# Patient Record
Sex: Female | Born: 1955 | Race: Black or African American | Hispanic: No | Marital: Married | Smoking: Never smoker
Health system: Southern US, Community
[De-identification: ages and names within clinical notes are randomized; demographics above are authoritative.]

## PROBLEM LIST (undated history)

## (undated) DIAGNOSIS — K589 Irritable bowel syndrome without diarrhea: Secondary | ICD-10-CM

## (undated) DIAGNOSIS — N200 Calculus of kidney: Secondary | ICD-10-CM

## (undated) DIAGNOSIS — A048 Other specified bacterial intestinal infections: Secondary | ICD-10-CM

## (undated) DIAGNOSIS — D219 Benign neoplasm of connective and other soft tissue, unspecified: Secondary | ICD-10-CM

## (undated) DIAGNOSIS — D573 Sickle-cell trait: Secondary | ICD-10-CM

## (undated) DIAGNOSIS — U071 COVID-19: Secondary | ICD-10-CM

## (undated) DIAGNOSIS — K219 Gastro-esophageal reflux disease without esophagitis: Secondary | ICD-10-CM

## (undated) DIAGNOSIS — D509 Iron deficiency anemia, unspecified: Secondary | ICD-10-CM

## (undated) DIAGNOSIS — Z87442 Personal history of urinary calculi: Secondary | ICD-10-CM

## (undated) DIAGNOSIS — D72819 Decreased white blood cell count, unspecified: Secondary | ICD-10-CM

## (undated) DIAGNOSIS — Z862 Personal history of diseases of the blood and blood-forming organs and certain disorders involving the immune mechanism: Secondary | ICD-10-CM

## (undated) DIAGNOSIS — M1990 Unspecified osteoarthritis, unspecified site: Secondary | ICD-10-CM

## (undated) DIAGNOSIS — R011 Cardiac murmur, unspecified: Secondary | ICD-10-CM

## (undated) DIAGNOSIS — T7840XA Allergy, unspecified, initial encounter: Secondary | ICD-10-CM

## (undated) HISTORY — DX: Gastro-esophageal reflux disease without esophagitis: K21.9

## (undated) HISTORY — DX: Calculus of kidney: N20.0

## (undated) HISTORY — DX: Cardiac murmur, unspecified: R01.1

## (undated) HISTORY — DX: Sickle-cell trait: D57.3

## (undated) HISTORY — PX: BREAST CYST EXCISION: SHX579

## (undated) HISTORY — DX: Irritable bowel syndrome, unspecified: K58.9

## (undated) HISTORY — DX: COVID-19: U07.1

## (undated) HISTORY — DX: Benign neoplasm of connective and other soft tissue, unspecified: D21.9

## (undated) HISTORY — PX: TUBAL LIGATION: SHX77

## (undated) HISTORY — DX: Other specified bacterial intestinal infections: A04.8

## (undated) HISTORY — PX: LITHOTRIPSY: SUR834

## (undated) HISTORY — DX: Decreased white blood cell count, unspecified: D72.819

## (undated) HISTORY — DX: Allergy, unspecified, initial encounter: T78.40XA

## (undated) HISTORY — PX: BREAST SURGERY: SHX581

---
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
## 2009-11-18 HISTORY — PX: LAPAROSCOPIC ASSISTED VAGINAL HYSTERECTOMY: SHX5398

## 2014-07-30 LAB — HM PAP SMEAR: HM Pap smear: NEGATIVE

## 2018-10-18 LAB — HM COLONOSCOPY

## 2019-12-16 ENCOUNTER — Encounter: Payer: Self-pay | Admitting: Adult Health

## 2019-12-16 ENCOUNTER — Ambulatory Visit (INDEPENDENT_AMBULATORY_CARE_PROVIDER_SITE_OTHER): Payer: Federal, State, Local not specified - PPO | Admitting: Adult Health

## 2019-12-16 ENCOUNTER — Other Ambulatory Visit: Payer: Self-pay

## 2019-12-16 VITALS — BP 122/82 | HR 69 | Temp 97.9°F | Resp 16 | Ht 66.0 in | Wt 146.2 lb

## 2019-12-16 DIAGNOSIS — Z1329 Encounter for screening for other suspected endocrine disorder: Secondary | ICD-10-CM

## 2019-12-16 DIAGNOSIS — R82998 Other abnormal findings in urine: Secondary | ICD-10-CM | POA: Insufficient documentation

## 2019-12-16 DIAGNOSIS — E559 Vitamin D deficiency, unspecified: Secondary | ICD-10-CM | POA: Insufficient documentation

## 2019-12-16 DIAGNOSIS — Z1389 Encounter for screening for other disorder: Secondary | ICD-10-CM

## 2019-12-16 DIAGNOSIS — N951 Menopausal and female climacteric states: Secondary | ICD-10-CM | POA: Insufficient documentation

## 2019-12-16 DIAGNOSIS — Z1231 Encounter for screening mammogram for malignant neoplasm of breast: Secondary | ICD-10-CM | POA: Insufficient documentation

## 2019-12-16 DIAGNOSIS — Z113 Encounter for screening for infections with a predominantly sexual mode of transmission: Secondary | ICD-10-CM

## 2019-12-16 DIAGNOSIS — Z0000 Encounter for general adult medical examination without abnormal findings: Secondary | ICD-10-CM

## 2019-12-16 DIAGNOSIS — Z13220 Encounter for screening for lipoid disorders: Secondary | ICD-10-CM

## 2019-12-16 DIAGNOSIS — Z90711 Acquired absence of uterus with remaining cervical stump: Secondary | ICD-10-CM | POA: Insufficient documentation

## 2019-12-16 DIAGNOSIS — M25449 Effusion, unspecified hand: Secondary | ICD-10-CM | POA: Insufficient documentation

## 2019-12-16 DIAGNOSIS — Z114 Encounter for screening for human immunodeficiency virus [HIV]: Secondary | ICD-10-CM

## 2019-12-16 DIAGNOSIS — Z90710 Acquired absence of both cervix and uterus: Secondary | ICD-10-CM

## 2019-12-16 HISTORY — DX: Encounter for screening for other disorder: Z13.89

## 2019-12-16 HISTORY — DX: Effusion, unspecified hand: M25.449

## 2019-12-16 HISTORY — DX: Menopausal and female climacteric states: N95.1

## 2019-12-16 HISTORY — DX: Other abnormal findings in urine: R82.998

## 2019-12-16 LAB — POCT URINALYSIS DIPSTICK
Bilirubin, UA: NEGATIVE
Blood, UA: NEGATIVE
Glucose, UA: NEGATIVE
Ketones, UA: NEGATIVE
Nitrite, UA: NEGATIVE
Protein, UA: POSITIVE — AB
Spec Grav, UA: 1.02 (ref 1.010–1.025)
Urobilinogen, UA: 0.2 E.U./dL
pH, UA: 5 (ref 5.0–8.0)

## 2019-12-16 NOTE — Patient Instructions (Addendum)
Atrophic Vaginitis Atrophic vaginitis is a condition in which the tissues that line the vagina become dry and thin. This condition occurs in women who have stopped having their period. It is caused by a drop in a female hormone (estrogen). This hormone helps:  To keep the vagina moist.  To make a clear fluid. This clear fluid helps: ? To make the vagina ready for sex. ? To protect the vagina from infection. If the lining of the vagina is dry and thin, it may cause irritation, burning, or itchiness. It may also:  Make sex painful.  Make an exam of your vagina painful.  Cause bleeding.  Make you lose interest in sex.  Cause a burning feeling when you pee (urinate).  Cause a brown or yellow fluid to come from your vagina. Some women do not have symptoms. Follow these instructions at home: Medicines  Take over-the-counter and prescription medicines only as told by your doctor.  Do not use herbs or other medicines unless your doctor says it is okay.  Use medicines for for dryness. These include: ? Oils to make the vagina soft. ? Creams. ? Moisturizers. General instructions  Do not douche.  Do not use products that can make your vagina dry. These include: ? Scented sprays. ? Scented tampons. ? Scented soaps.  Sex can help increase blood flow and soften the tissue in the vagina. If it hurts to have sex: ? Tell your partner. ? Use products to make sex more comfortable. Use these only as told by your doctor. Contact a doctor if you:  Have discharge from the vagina that is different than usual.  Have a bad smell coming from your vagina.  Have new symptoms.  Do not get better.  Get worse. Summary  Atrophic vaginitis is a condition in which the lining of the vagina becomes dry and thin.  This condition affects women who have stopped having their periods.  Treatment may include using products that help make the vagina soft.  Call a doctor if do not get better with  treatment. This information is not intended to replace advice given to you by your health care provider. Make sure you discuss any questions you have with your health care provider. Document Revised: 10/09/2017 Document Reviewed: 10/09/2017 Elsevier Patient Education  2020 [place] Maintenance, Female Adopting a healthy lifestyle and getting preventive care are important in promoting health and wellness. Ask your health care provider about:  The right schedule for you to have regular tests and exams.  Things you can do on your own to prevent diseases and keep yourself healthy. What should I know about diet, weight, and exercise? Eat a healthy diet   Eat a diet that includes plenty of vegetables, fruits, low-fat dairy products, and lean protein.  Do not eat a lot of foods that are high in solid fats, added sugars, or sodium. Maintain a healthy weight Body mass index (BMI) is used to identify weight problems. It estimates body fat based on height and weight. Your health care provider can help determine your BMI and help you achieve or maintain a healthy weight. Get regular exercise Get regular exercise. This is one of the most important things you can do for your health. Most adults should:  Exercise for at least 150 minutes each week. The exercise should increase your heart rate and make you sweat (moderate-intensity exercise).  Do strengthening exercises at least twice a week. This is in addition to the moderate-intensity exercise.  Spend less time sitting. Even light physical activity can be beneficial. Watch cholesterol and blood lipids Have your blood tested for lipids and cholesterol at 64 years of age, then have this test every 5 years. Have your cholesterol levels checked more often if:  Your lipid or cholesterol levels are high.  You are older than 64 years of age.  You are at high risk for heart disease. What should I know about cancer screening? Depending  on your health history and family history, you may need to have cancer screening at various ages. This may include screening for:  Breast cancer.  Cervical cancer.  Colorectal cancer.  Skin cancer.  Lung cancer. What should I know about heart disease, diabetes, and high blood pressure? Blood pressure and heart disease  High blood pressure causes heart disease and increases the risk of stroke. This is more likely to develop in people who have high blood pressure readings, are of African descent, or are overweight.  Have your blood pressure checked: ? Every 3-5 years if you are 77-29 years of age. ? Every year if you are 60 years old or older. Diabetes Have regular diabetes screenings. This checks your fasting blood sugar level. Have the screening done:  Once every three years after age 82 if you are at a normal weight and have a low risk for diabetes.  More often and at a younger age if you are overweight or have a high risk for diabetes. What should I know about preventing infection? Hepatitis B If you have a higher risk for hepatitis B, you should be screened for this virus. Talk with your health care provider to find out if you are at risk for hepatitis B infection. Hepatitis C Testing is recommended for:  Everyone born from [birth year] through 1965.  Anyone with known risk factors for hepatitis C. Sexually transmitted infections (STIs)  Get screened for STIs, including gonorrhea and chlamydia, if: ? You are sexually active and are younger than 64 years of age. ? You are older than 64 years of age and your health care provider tells you that you are at risk for this type of infection. ? Your sexual activity has changed since you were last screened, and you are at increased risk for chlamydia or gonorrhea. Ask your health care provider if you are at risk.  Ask your health care provider about whether you are at high risk for HIV. Your health care provider may recommend a  prescription medicine to help prevent HIV infection. If you choose to take medicine to prevent HIV, you should first get tested for HIV. You should then be tested every 3 months for as long as you are taking the medicine. Pregnancy  If you are about to stop having your period (premenopausal) and you may become pregnant, seek counseling before you get pregnant.  Take 400 to 800 micrograms (mcg) of folic acid every day if you become pregnant.  Ask for birth control (contraception) if you want to prevent pregnancy. Osteoporosis and menopause Osteoporosis is a disease in which the bones lose minerals and strength with aging. This can result in bone fractures. If you are 31 years old or older, or if you are at risk for osteoporosis and fractures, ask your health care provider if you should:  Be screened for bone loss.  Take a calcium or vitamin D supplement to lower your risk of fractures.  Be given hormone replacement therapy (HRT) to treat symptoms of menopause. Follow these instructions at  home: Lifestyle  Do not use any products that contain nicotine or tobacco, such as cigarettes, e-cigarettes, and chewing tobacco. If you need help quitting, ask your health care provider.  Do not use street drugs.  Do not share needles.  Ask your health care provider for help if you need support or information about quitting drugs. Alcohol use  Do not drink alcohol if: ? Your health care provider tells you not to drink. ? You are pregnant, may be pregnant, or are planning to become pregnant.  If you drink alcohol: ? Limit how much you use to 0-1 drink a day. ? Limit intake if you are breastfeeding.  Be aware of how much alcohol is in your drink. In the [place] one drink equals one 12 oz bottle of beer (355 mL), one 5 oz glass of wine (148 mL), or one 1 oz glass of hard liquor (44 mL). General instructions  Schedule regular health, dental, and eye exams.  Stay current with your  vaccines.  Tell your health care provider if: ? You often feel depressed. ? You have ever been abused or do not feel safe at home. Summary  Adopting a healthy lifestyle and getting preventive care are important in promoting health and wellness.  Follow your health care provider's instructions about healthy diet, exercising, and getting tested or screened for diseases.  Follow your health care provider's instructions on monitoring your cholesterol and blood pressure. This information is not intended to replace advice given to you by your health care provider. Make sure you discuss any questions you have with your health care provider. Document Revised: 09/19/2018 Document Reviewed: 09/19/2018 Elsevier Patient Education  2020 [employer].

## 2019-12-16 NOTE — Progress Notes (Addendum)
Patient: Kim Mitchell, Female    DOB: 05/22/1956, 64 y.o.   MRN: [id number] Visit Date: 12/16/2019  Today's Provider: Marcille Buffy, FNP   Chief Complaint  Patient presents with  . New Patient (Initial Visit)   Subjective:    New Patient Kim Mitchell is a 64 y.o. female who presents today for health maintenance and establish care as a new patient. Patient reports that she moved from [place] 9 months ago, her previous primary doctor was Dr. Clearnce Sorrel. Patient reports that last pap exam and colonoscopy screening was in 2019 and was normal, she states that her mammogram was last done in spring of 2020 and will be due.  She feels well. She reports she is not exercising, but reports that she is active taking care of her elderly family member - her great aunt is 36.    She reports she is sleeping fairly well, patient reports that she is restless at night.  She is feeling well.   Patient  denies any fever, body aches,chills, rash, chest pain, shortness of breath, nausea, vomiting, or diarrhea.   History of hysterectomy- laparoscopic assisted vaginal hysterectomy 11/18/2009 - she denies any malignancy.  She had estrogen ring and it was discontinued due to cost. Intercourse is painful due to dryness. Denies any lesions or sores. She has tried some lubricants and she reports some improvement.    She had colonoscopy and endoscopy and was given Liness. She reports all symptoms have improved.    Patient  denies any fever, body aches,chills, rash, chest pain, shortness of breath, nausea, vomiting, or diarrhea.    -----------------------------------------------------------------   Review of Systems  Constitutional: Negative for activity change, appetite change, chills, diaphoresis, fatigue, fever and unexpected weight change.  HENT: Positive for sinus pressure. Negative for congestion, dental problem, drooling, ear discharge, ear pain, facial swelling, hearing  loss, mouth sores, nosebleeds, postnasal drip, rhinorrhea, sinus pain, sneezing, sore throat, tinnitus, trouble swallowing and voice change.   Respiratory: Negative.   Cardiovascular: Negative.   Gastrointestinal: Positive for abdominal distention and constipation (occasioinally. Denies any rectal or oral bleeding. ). Negative for abdominal pain, anal bleeding, blood in stool, diarrhea, nausea, rectal pain and vomiting.  Genitourinary: Negative for decreased urine volume, difficulty urinating, dysuria, enuresis, flank pain, frequency, genital sores, hematuria, menstrual problem, pelvic pain, urgency, vaginal bleeding, vaginal discharge and vaginal pain. Dyspareunia: vaginal dryness since hysterectomy, denies any sores or lesions. lubricant helps. denies any abnormal vaginal discharge. She has no desire for hormones she reports.   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 ). Negative for arthralgias, back pain, gait problem, myalgias, neck pain and neck stiffness.  Skin: Negative.   Allergic/Immunologic: Positive for environmental allergies.  Neurological: Negative.   Psychiatric/Behavioral: Negative.   All other systems reviewed and are negative.   Social History She   Social History   Socioeconomic History  . Marital status: Married    Spouse name: Not on file  . Number of children: Not on file  . Years of education: Not on file  . Highest education level: Not on file  Occupational History  . Not on file  Tobacco Use  . Smoking status: Not on file  Substance and Sexual Activity  . Alcohol use: Not on file  . Drug use: Not on file  . Sexual activity: Not on file  Other Topics Concern  . Not on file  Social History Narrative  . Not on file   Social Determinants of Health   Financial Resource Strain:   . Difficulty of Paying Living Expenses: Not on file  Food Insecurity:   . Worried About Charity fundraiser in the  Last Year: Not on file  . Ran Out of Food in the Last Year: Not on file  Transportation Needs:   . Lack of Transportation (Medical): Not on file  . Lack of Transportation (Non-Medical): Not on file  Physical Activity:   . Days of Exercise per Week: Not on file  . Minutes of Exercise per Session: Not on file  Stress:   . Feeling of Stress : Not on file  Social Connections:   . Frequency of Communication with Friends and Family: Not on file  . Frequency of Social Gatherings with Friends and Family: Not on file  . Attends Religious Services: Not on file  . Active Member of Clubs or Organizations: Not on file  . Attends Archivist Meetings: Not on file  . Marital Status: Not on file    There are no problems to display for this patient.   Past Surgical History:  Procedure Laterality Date  . ABDOMINAL HYSTERECTOMY    . BREAST SURGERY    . TUBAL LIGATION       Family History  Family Status  Relation Name Status  . Mat Aunt  (Not Specified)  . MGM  (Not Specified)  . MGF  (Not Specified)   Her family history includes Cancer in her maternal aunt and maternal grandfather; Hypertension in her maternal grandmother.     Allergies  Allergen Reactions  . Sulfa Antibiotics Swelling    Facial Swelling  . Demerol [Meperidine Hcl] Rash    Previous Medications   CALCIUM CARBONATE-VITAMIN D (CALCIUM 600+D HIGH POTENCY) 600-400 MG-UNIT TABLET    Take 1 tablet by mouth daily.   CHOLECALCIFEROL (VITAMIN D3) 50 MCG (2000 UT) TABS    Take by mouth.   LINACLOTIDE (LINZESS) 145 MCG CAPS CAPSULE    Take 145 mcg by mouth daily before breakfast.   MULTIPLE VITAMINS-MINERALS (CENTRUM SILVER PO)    Take by mouth.    Patient Care Team: Doreen Beam, FNP as PCP - General (Family Medicine)      Objective:  Blood pressure 122/82, pulse 69, temperature 97.9 F (36.6 C), temperature source Temporal, resp. rate 16, height 5\' 6"  (1.676 m), weight 146 lb 3.2 oz (66.3 kg), SpO2 99  %.   Physical Exam Vitals reviewed.  Constitutional:      General: She is not in acute distress.    Appearance: She is well-developed. She is not diaphoretic.     Interventions: She is not intubated. HENT:     Head: Normocephalic and atraumatic.     Right Ear: External ear normal.     Left Ear: External ear normal.     Nose: Nose normal.     Mouth/Throat:     Pharynx: No oropharyngeal exudate.  Eyes:     General: Lids are normal. No scleral icterus.       Right eye: No discharge.        Left eye: No discharge.     Conjunctiva/sclera: Conjunctivae normal.     Right eye: Right conjunctiva is not injected. No exudate or hemorrhage.    Left eye: Left conjunctiva is not injected. No exudate or hemorrhage.    Pupils: Pupils are equal, round, and reactive to light.  Neck:  Thyroid: No thyroid mass or thyromegaly.     Vascular: Normal carotid pulses. No carotid bruit, hepatojugular reflux or JVD.     Trachea: Trachea and phonation normal. No tracheal tenderness or tracheal deviation.     Meningeal: Brudzinski's sign and Kernig's sign absent.  Cardiovascular:     Rate and Rhythm: Normal rate and regular rhythm.     Pulses: Normal pulses.          Radial pulses are 2+ on the right side and 2+ on the left side.       Dorsalis pedis pulses are 2+ on the right side and 2+ on the left side.       Posterior tibial pulses are 2+ on the right side and 2+ on the left side.     Heart sounds: Normal heart sounds, S1 normal and S2 normal. Heart sounds not distant. No murmur. No friction rub. No gallop.   Pulmonary:     Effort: Pulmonary effort is normal. No tachypnea, bradypnea, accessory muscle usage or respiratory distress. She is not intubated.     Breath sounds: Normal breath sounds. No stridor. No wheezing or rales.  Chest:     Chest wall: No tenderness.  Abdominal:     General: Bowel sounds are normal. There is no distension or abdominal bruit.     Palpations: Abdomen is soft. There is  no shifting dullness, fluid wave, hepatomegaly, splenomegaly, mass or pulsatile mass.     Tenderness: There is no abdominal tenderness. There is no guarding or rebound.     Hernia: No hernia is present.  Genitourinary:    Comments: Deferred, patient will return for vaginal exam as needed.   No LMP recorded. Patient has had a hysterectomy.  Musculoskeletal:        General: No tenderness or deformity. Normal range of motion.     Cervical back: Full passive range of motion without pain, normal range of motion and neck supple. No edema, erythema or rigidity. No spinous process tenderness or muscular tenderness. Normal range of motion.  Lymphadenopathy:     Head:     Right side of head: No submental, submandibular, tonsillar, preauricular, posterior auricular or occipital adenopathy.     Left side of head: No submental, submandibular, tonsillar, preauricular, posterior auricular or occipital adenopathy.     Cervical: No cervical adenopathy.     Right cervical: No superficial, deep or posterior cervical adenopathy.    Left cervical: No superficial, deep or posterior cervical adenopathy.     Upper Body:     Right upper body: No supraclavicular or pectoral adenopathy.     Left upper body: No supraclavicular or pectoral adenopathy.  Skin:    General: Skin is warm and dry.     Coloration: Skin is not pale.     Findings: No abrasion, bruising, burn, ecchymosis, erythema, lesion, petechiae or rash.     Nails: There is no clubbing.  Neurological:     Mental Status: She is alert and oriented to person, place, and time.     GCS: GCS eye subscore is 4. GCS verbal subscore is 5. GCS motor subscore is 6.     Cranial Nerves: Cranial nerves are intact. No cranial nerve deficit.     Sensory: Sensation is intact. No sensory deficit.     Motor: Motor function is intact. No tremor, atrophy, abnormal muscle tone or seizure activity.     Coordination: Coordination is intact. Coordination normal.     Gait:  Gait  normal.     Deep Tendon Reflexes: Reflexes are normal and symmetric. Reflexes normal. Babinski sign absent on the right side. Babinski sign absent on the left side.     Reflex Scores:      Tricep reflexes are 2+ on the right side and 2+ on the left side.      Bicep reflexes are 2+ on the right side and 2+ on the left side.      Brachioradialis reflexes are 2+ on the right side and 2+ on the left side.      Patellar reflexes are 2+ on the right side and 2+ on the left side.      Achilles reflexes are 2+ on the right side and 2+ on the left side.    Comments: No appreciable swelling in bilateral hand/ finger joints.  She has this intermittently reported.   Psychiatric:        Attention and Perception: Attention and perception normal.        Mood and Affect: Mood and affect normal.        Speech: Speech normal.        Behavior: Behavior normal. Behavior is cooperative.        Thought Content: Thought content normal.        Cognition and Memory: Cognition normal.        Judgment: Judgment normal.      Depression Screen PHQ 2/9 Scores 12/16/2019  PHQ - 2 Score 0  PHQ- 9 Score 2      Assessment & Plan:     Routine Health Maintenance and Physical Exam  Exercise Activities and Dietary recommendations Goals   None      There is no immunization history on file for this patient.  Health Maintenance  Topic Date Due  . Hepatitis C Screening  06-26-1956  . HIV Screening  09/21/1971  . TETANUS/TDAP  09/21/1975  . PAP SMEAR-Modifier  09/20/1977  . MAMMOGRAM  09/20/2006  . COLONOSCOPY  09/20/2006  . INFLUENZA VACCINE  05/11/2019    Encounter for routine adult health examination without abnormal findings - Plan: POCT urinalysis dipstick  Encounter for screening mammogram for malignant neoplasm of breast - Plan: MM Digital Screening  Leukocytes in urine - Plan: Urine Culture  Vitamin D deficiency - Plan: VITAMIN D 25 Hydroxy (Vit-D Deficiency, Fractures)  Swelling of  joint of hand, unspecified laterality - Plan: CBC with Differential/Platelet, Comprehensive Metabolic Panel (CMET), Rheumatoid factor  Screening for HIV (human immunodeficiency virus) - Plan: HIV Antibody (routine testing w rflx)  Screening for STD (sexually transmitted disease) - Plan: Hepatitis panel, acute, HIV Antibody (routine testing w rflx)  Screening cholesterol level - Plan: Lipid Panel w/o Chol/HDL Ratio  Screening for thyroid disorder - Plan: TSH  Vaginal dryness, menopausal  History of hysterectomy- full   Yearly eye exams and every 6 months dental screenings/ exams/ cleaning is recommended.  Will check rheumatoid level, she has had joint swelling in hands for many years, no change or worsening symptom.  She has been screened for hepatitis in past- desires screening again, her husband was treated for Hep C and is in remission after treatment. She has never tested positive. She would also like HIV testing and has no other STD testing concerns at today's visit.  Orders Placed This Encounter  Procedures  . Urine Culture  . MM Digital Screening    Standing Status:   Future    Standing Expiration Date:   02/14/2021    Order  Specific Question:   Reason for Exam (SYMPTOM  OR DIAGNOSIS REQUIRED)    Answer:   breast cancer screening    Order Specific Question:   Preferred imaging location?    Answer:   [facility]  . CBC with Differential/Platelet  . Comprehensive Metabolic Panel (CMET)  . Lipid Panel w/o Chol/HDL Ratio  . TSH  . VITAMIN D 25 Hydroxy (Vit-D Deficiency, Fractures)  . Rheumatoid factor  . Hepatitis panel, acute  . HIV Antibody (routine testing w rflx)  . POCT urinalysis dipstick   No vaginal exam performed she will return for vaginal exam as needed, she has had a full hysterectomy. No abnormal discharge. She does have vaginal dryness and pain with intercourse that is relieved some with lubricant.  Yearly physical with labs and return for vaginal exam as  needed and also PRN for any other concerns. She has no dysuria or other symptoms, does have leukocytes in urine on POCT today in office, will culture and call if any bacteria that needs treating. Patient to report any symptoms to clinic.   Return if symptoms worsen or fail to improve, for Go to Emergency room/ urgent care if worse.  Advised patient call the office or your primary care doctor for an appointment if no improvement within 72 hours or if any symptoms change or worsen at any time  Advised ER or urgent Care if after hours or on weekend. Call 911 for emergency symptoms at any time.Patinet verbalized understanding of all instructions given/reviewed and treatment plan and has no further questions or concerns at this time.     Discussed health benefits of physical activity, and encouraged her to engage in regular exercise appropriate for her age and condition.   The entirety of the information documented in the History of Present Illness, Review of Systems and Physical Exam were personally obtained by me. Portions of this information were initially documented by the  Certified Medical Assistant whose name is documented in [facility] and reviewed by me for thoroughness and accuracy.  I have personally performed the exam and reviewed the chart and it is accurate to the best of my knowledge.  Haematologist has been used and any errors in dictation or transcription are unintentional.  Kelby Aline. [facility]    --------------------------------------------------------------------

## 2019-12-16 NOTE — Progress Notes (Signed)
Sent for culture.

## 2019-12-17 LAB — COMPREHENSIVE METABOLIC PANEL
ALT: 15 IU/L (ref 0–32)
AST: 24 IU/L (ref 0–40)
Albumin/Globulin Ratio: 2 (ref 1.2–2.2)
Albumin: 4.6 g/dL (ref 3.8–4.8)
Alkaline Phosphatase: 69 IU/L (ref 39–117)
BUN/Creatinine Ratio: 16 (ref 12–28)
BUN: 14 mg/dL (ref 8–27)
Bilirubin Total: 0.2 mg/dL (ref 0.0–1.2)
CO2: 25 mmol/L (ref 20–29)
Calcium: 9.4 mg/dL (ref 8.7–10.3)
Chloride: 106 mmol/L (ref 96–106)
Creatinine, Ser: 0.87 mg/dL (ref 0.57–1.00)
GFR calc Af Amer: 82 mL/min/{1.73_m2} (ref >59)
GFR calc non Af Amer: 71 mL/min/{1.73_m2} (ref >59)
Globulin, Total: 2.3 g/dL (ref 1.5–4.5)
Glucose: 84 mg/dL (ref 65–99)
Potassium: 5 mmol/L (ref 3.5–5.2)
Sodium: 143 mmol/L (ref 134–144)
Total Protein: 6.9 g/dL (ref 6.0–8.5)

## 2019-12-17 LAB — CBC WITH DIFFERENTIAL/PLATELET
Basophils Absolute: 0 10*3/uL (ref 0.0–0.2)
Basos: 1 %
EOS (ABSOLUTE): 0.3 10*3/uL (ref 0.0–0.4)
Eos: 9 %
Hematocrit: 38.5 % (ref 34.0–46.6)
Hemoglobin: 12.1 g/dL (ref 11.1–15.9)
Immature Grans (Abs): 0 10*3/uL (ref 0.0–0.1)
Immature Granulocytes: 0 %
Lymphocytes Absolute: 1.6 10*3/uL (ref 0.7–3.1)
Lymphs: 47 %
MCH: 23.4 pg — ABNORMAL LOW (ref 26.6–33.0)
MCHC: 31.4 g/dL — ABNORMAL LOW (ref 31.5–35.7)
MCV: 75 fL — ABNORMAL LOW (ref 79–97)
Monocytes Absolute: 0.4 10*3/uL (ref 0.1–0.9)
Monocytes: 11 %
Neutrophils Absolute: 1.1 10*3/uL — ABNORMAL LOW (ref 1.4–7.0)
Neutrophils: 32 %
Platelets: 222 10*3/uL (ref 150–450)
RBC: 5.17 x10E6/uL (ref 3.77–5.28)
RDW: 14.7 % (ref 11.7–15.4)
WBC: 3.4 10*3/uL (ref 3.4–10.8)

## 2019-12-17 LAB — HEPATITIS PANEL, ACUTE
Hep A IgM: NEGATIVE
Hep B C IgM: NEGATIVE
Hep C Virus Ab: <0.1 s/co ratio (ref 0.0–0.9)
Hepatitis B Surface Ag: NEGATIVE

## 2019-12-17 LAB — VITAMIN D 25 HYDROXY (VIT D DEFICIENCY, FRACTURES): Vit D, 25-Hydroxy: 43 ng/mL (ref 30.0–100.0)

## 2019-12-17 LAB — LIPID PANEL W/O CHOL/HDL RATIO
Cholesterol, Total: 147 mg/dL (ref 100–199)
HDL: 60 mg/dL (ref >39)
LDL Chol Calc (NIH): 76 mg/dL (ref 0–99)
Triglycerides: 48 mg/dL (ref 0–149)
VLDL Cholesterol Cal: 11 mg/dL (ref 5–40)

## 2019-12-17 LAB — RHEUMATOID FACTOR: Rheumatoid fact SerPl-aCnc: <10 IU/mL (ref 0.0–13.9)

## 2019-12-17 LAB — TSH: TSH: 3.32 u[IU]/mL (ref 0.450–4.500)

## 2019-12-17 LAB — HIV ANTIBODY (ROUTINE TESTING W REFLEX): HIV Screen 4th Generation wRfx: NONREACTIVE

## 2019-12-19 ENCOUNTER — Ambulatory Visit
Admission: RE | Admit: 2019-12-19 | Discharge: 2019-12-19 | Disposition: A | Discharge status: Home / Self Care | Payer: Federal, State, Local not specified - PPO | Source: Ambulatory Visit | Attending: Adult Health | Admitting: Adult Health

## 2019-12-19 DIAGNOSIS — Z1231 Encounter for screening mammogram for malignant neoplasm of breast: Secondary | ICD-10-CM | POA: Insufficient documentation

## 2019-12-19 LAB — URINE CULTURE

## 2019-12-20 NOTE — Progress Notes (Signed)
CBC shows no signs of infection. Mild irregularities showing she may be becoming anemic. Verify no rectal or vaginal bleeding. Taking an iron supplement regularly ? We added on a iron panel to labs and it is still pending.  CMP is normal, electrolytes normal as well as kidney and liver function.  Cholesterol looks great within normal range.  Thyroid lab within normal range. RA factor is negative. Acute hepatitis A, B and C is negative. HIV negative.  Vitamin D level is normal - recommend Vitamin D 3 over the counter at 2,000 to 4,000 international units daily by mouth.  Recheck CBC in around 3 to 4 months we can do Vitamin d then as well.

## 2019-12-27 ENCOUNTER — Ambulatory Visit: Payer: Federal, State, Local not specified - PPO | Attending: Internal Medicine

## 2019-12-27 DIAGNOSIS — Z23 Encounter for immunization: Secondary | ICD-10-CM

## 2019-12-27 NOTE — Progress Notes (Signed)
Normal mammogram- repeat in one year per radiology.

## 2019-12-27 NOTE — Progress Notes (Signed)
   Covid-19 Vaccination Clinic  Name:  Kim Mitchell    MRN: [id number] DOB: May 14, 1956  12/27/2019  [name] was observed post Covid-19 immunization for 15 minutes without incident. She was provided with Vaccine Information Sheet and instruction to access the V-Safe system.   [name] was instructed to call 911 with any severe reactions post vaccine: Marland Kitchen Difficulty breathing  . Swelling of face and throat  . A fast heartbeat  . A bad rash all over body  . Dizziness and weakness   Immunizations Administered    Name Date Dose VIS Date Route   Pfizer COVID-19 Vaccine 12/27/2019  9:44 AM 0.3 mL 09/20/2019 Intramuscular   Manufacturer: [place]   Lot: [id number]   [place]: [id number]

## 2020-01-21 ENCOUNTER — Ambulatory Visit: Payer: Federal, State, Local not specified - PPO | Attending: Internal Medicine

## 2020-01-21 DIAGNOSIS — Z23 Encounter for immunization: Secondary | ICD-10-CM

## 2020-01-21 NOTE — Progress Notes (Signed)
   Covid-19 Vaccination Clinic  Name:  Kim Mitchell    MRN: [id number] DOB: [birth year]  01/21/2020  Kim Mitchell was observed post Covid-19 immunization for 15 minutes without incident. She was provided with Vaccine Information Sheet and instruction to access the V-Safe system.   Kim Mitchell was instructed to call 911 with any severe reactions post vaccine: Marland Kitchen Difficulty breathing  . Swelling of face and throat  . A fast heartbeat  . A bad rash all over body  . Dizziness and weakness   Immunizations Administered    Name Date Dose VIS Date Route   Pfizer COVID-19 Vaccine 01/21/2020 10:41 AM 0.3 mL 09/20/2019 Intramuscular   Manufacturer: [place]   Lot: [id number]   [place]: [id number]

## 2020-08-20 DIAGNOSIS — Z03818 Encounter for observation for suspected exposure to other biological agents ruled out: Secondary | ICD-10-CM | POA: Diagnosis not present

## 2020-08-20 DIAGNOSIS — Z20822 Contact with and (suspected) exposure to covid-19: Secondary | ICD-10-CM | POA: Diagnosis not present

## 2020-09-30 DIAGNOSIS — Z20822 Contact with and (suspected) exposure to covid-19: Secondary | ICD-10-CM | POA: Diagnosis not present

## 2020-12-17 ENCOUNTER — Encounter: Payer: Federal, State, Local not specified - PPO | Admitting: Adult Health

## 2020-12-23 ENCOUNTER — Ambulatory Visit
Admission: RE | Admit: 2020-12-23 | Discharge: 2020-12-23 | Disposition: A | Discharge status: Home / Self Care | Payer: Federal, State, Local not specified - PPO | Attending: Adult Health | Admitting: Adult Health

## 2020-12-23 ENCOUNTER — Encounter: Payer: Self-pay | Admitting: Adult Health

## 2020-12-23 ENCOUNTER — Ambulatory Visit (INDEPENDENT_AMBULATORY_CARE_PROVIDER_SITE_OTHER): Payer: Federal, State, Local not specified - PPO | Admitting: Adult Health

## 2020-12-23 ENCOUNTER — Telehealth: Payer: Self-pay

## 2020-12-23 ENCOUNTER — Other Ambulatory Visit: Payer: Self-pay

## 2020-12-23 ENCOUNTER — Ambulatory Visit
Admission: RE | Admit: 2020-12-23 | Discharge: 2020-12-23 | Disposition: A | Discharge status: Home / Self Care | Payer: Federal, State, Local not specified - PPO | Source: Ambulatory Visit | Attending: Adult Health | Admitting: Adult Health

## 2020-12-23 VITALS — BP 127/81 | HR 87 | Temp 98.0°F | Resp 15 | Wt 149.6 lb

## 2020-12-23 DIAGNOSIS — M25541 Pain in joints of right hand: Secondary | ICD-10-CM

## 2020-12-23 DIAGNOSIS — J302 Other seasonal allergic rhinitis: Secondary | ICD-10-CM

## 2020-12-23 DIAGNOSIS — M25572 Pain in left ankle and joints of left foot: Secondary | ICD-10-CM | POA: Diagnosis not present

## 2020-12-23 DIAGNOSIS — E559 Vitamin D deficiency, unspecified: Secondary | ICD-10-CM | POA: Diagnosis not present

## 2020-12-23 DIAGNOSIS — R718 Other abnormality of red blood cells: Secondary | ICD-10-CM | POA: Diagnosis not present

## 2020-12-23 DIAGNOSIS — Z1389 Encounter for screening for other disorder: Secondary | ICD-10-CM

## 2020-12-23 DIAGNOSIS — Z1231 Encounter for screening mammogram for malignant neoplasm of breast: Secondary | ICD-10-CM

## 2020-12-23 DIAGNOSIS — Z90711 Acquired absence of uterus with remaining cervical stump: Secondary | ICD-10-CM | POA: Diagnosis not present

## 2020-12-23 DIAGNOSIS — M7989 Other specified soft tissue disorders: Secondary | ICD-10-CM | POA: Diagnosis not present

## 2020-12-23 LAB — POCT URINALYSIS DIPSTICK (MANUAL)
Leukocytes, UA: NEGATIVE
Nitrite, UA: NEGATIVE
Poct Bilirubin: NEGATIVE
Poct Blood: NEGATIVE
Poct Glucose: NORMAL mg/dL
Poct Ketones: NEGATIVE
Poct Protein: NEGATIVE mg/dL
Poct Urobilinogen: NORMAL mg/dL
Spec Grav, UA: 1.01 (ref 1.010–1.025)
pH, UA: 5 (ref 5.0–8.0)

## 2020-12-23 MED ORDER — LEVOCETIRIZINE DIHYDROCHLORIDE 5 MG PO TABS: 5.0000 mg | ORAL_TABLET | Freq: Every evening | ORAL | 1 refills | Status: DC

## 2020-12-23 MED ORDER — FLUTICASONE PROPIONATE 50 MCG/ACT NA SUSP: 2.0000 | Freq: Every day | NASAL | 2 refills | Status: DC

## 2020-12-23 NOTE — Patient Instructions (Signed)
Call to schedule your screening mammogram. Your orders have been placed for your exam.  Let our office know if you have questions, concerns, or any difficulty scheduling.  If normal results then yearly screening mammograms are recommended unless you notice  Changes in your breast then you should schedule a follow up office visit. If abnormal results  Further imaging will be warranted and sooner follow up as determined by the radiologist at the [facility].   [facility] [address] [place], [place] [address]  Main: [phone]     Health Maintenance, Female Adopting a healthy lifestyle and getting preventive care are important in promoting health and wellness. Ask your health care provider about:  The right schedule for you to have regular tests and exams.  Things you can do on your own to prevent diseases and keep yourself healthy. What should I know about diet, weight, and exercise? Eat a healthy diet  Eat a diet that includes plenty of vegetables, fruits, low-fat dairy products, and lean protein.  Do not eat a lot of foods that are high in solid fats, added sugars, or sodium.   Maintain a healthy weight Body mass index (BMI) is used to identify weight problems. It estimates body fat based on height and weight. Your health care provider can help determine your BMI and help you achieve or maintain a healthy weight. Get regular exercise Get regular exercise. This is one of the most important things you can do for your health. Most adults should:  Exercise for at least 150 minutes each week. The exercise should increase your heart rate and make you sweat (moderate-intensity exercise).  Do strengthening exercises at least twice a week. This is in addition to the moderate-intensity exercise.  Spend less time sitting. Even light physical activity can be beneficial. Watch cholesterol and blood lipids Have your blood tested for lipids and  cholesterol at 65 years of age, then have this test every 5 years. Have your cholesterol levels checked more often if:  Your lipid or cholesterol levels are high.  You are older than 65 years of age.  You are at high risk for heart disease. What should I know about cancer screening? Depending on your health history and family history, you may need to have cancer screening at various ages. This may include screening for:  Breast cancer.  Cervical cancer.  Colorectal cancer.  Skin cancer.  Lung cancer. What should I know about heart disease, diabetes, and high blood pressure? Blood pressure and heart disease  High blood pressure causes heart disease and increases the risk of stroke. This is more likely to develop in people who have high blood pressure readings, are of African descent, or are overweight.  Have your blood pressure checked: ? Every 3-5 years if you are 18-39 years of age. ? Every year if you are 40 years old or older. Diabetes Have regular diabetes screenings. This checks your fasting blood sugar level. Have the screening done:  Once every three years after age 40 if you are at a normal weight and have a low risk for diabetes.  More often and at a younger age if you are overweight or have a high risk for diabetes. What should I know about preventing infection? Hepatitis B If you have a higher risk for hepatitis B, you should be screened for this virus. Talk with your health care provider to find out if you are at risk for hepatitis B infection. Hepatitis   C Testing is recommended for:  Everyone born from 1945 through 1965.  Anyone with known risk factors for hepatitis C. Sexually transmitted infections (STIs)  Get screened for STIs, including gonorrhea and chlamydia, if: ? You are sexually active and are younger than 65 years of age. ? You are older than 65 years of age and your health care provider tells you that you are at risk for this type of  infection. ? Your sexual activity has changed since you were last screened, and you are at increased risk for chlamydia or gonorrhea. Ask your health care provider if you are at risk.  Ask your health care provider about whether you are at high risk for HIV. Your health care provider may recommend a prescription medicine to help prevent HIV infection. If you choose to take medicine to prevent HIV, you should first get tested for HIV. You should then be tested every 3 months for as long as you are taking the medicine. Pregnancy  If you are about to stop having your period (premenopausal) and you may become pregnant, seek counseling before you get pregnant.  Take 400 to 800 micrograms (mcg) of folic acid every day if you become pregnant.  Ask for birth control (contraception) if you want to prevent pregnancy. Osteoporosis and menopause Osteoporosis is a disease in which the bones lose minerals and strength with aging. This can result in bone fractures. If you are 65 years old or older, or if you are at risk for osteoporosis and fractures, ask your health care provider if you should:  Be screened for bone loss.  Take a calcium or vitamin D supplement to lower your risk of fractures.  Be given hormone replacement therapy (HRT) to treat symptoms of menopause. Follow these instructions at home: Lifestyle  Do not use any products that contain nicotine or tobacco, such as cigarettes, e-cigarettes, and chewing tobacco. If you need help quitting, ask your health care provider.  Do not use street drugs.  Do not share needles.  Ask your health care provider for help if you need support or information about quitting drugs. Alcohol use  Do not drink alcohol if: ? Your health care provider tells you not to drink. ? You are pregnant, may be pregnant, or are planning to become pregnant.  If you drink alcohol: ? Limit how much you use to 0-1 drink a day. ? Limit intake if you are  breastfeeding.  Be aware of how much alcohol is in your drink. In the [place] one drink equals one 12 oz bottle of beer (355 mL), one 5 oz glass of wine (148 mL), or one 1 oz glass of hard liquor (44 mL). General instructions  Schedule regular health, dental, and eye exams.  Stay current with your vaccines.  Tell your health care provider if: ? You often feel depressed. ? You have ever been abused or do not feel safe at home. Summary  Adopting a healthy lifestyle and getting preventive care are important in promoting health and wellness.  Follow your health care provider's instructions about healthy diet, exercising, and getting tested or screened for diseases.  Follow your health care provider's instructions on monitoring your cholesterol and blood pressure. This information is not intended to replace advice given to you by your health care provider. Make sure you discuss any questions you have with your health care provider. Document Revised: 09/19/2018 Document Reviewed: 09/19/2018 Elsevier Patient Education  2021 [employer].  

## 2020-12-23 NOTE — Telephone Encounter (Signed)
Patient walked into office and I explained as below. KW

## 2020-12-23 NOTE — Telephone Encounter (Signed)
Copied from [place] [phone]. Topic: General - Other >> Dec 23, 2020 12:14 PM Keene Breath wrote: Reason for CRM: Patient called to ask some questions regarding her recent visit today.  Patient would like to ask about the x-ray and gyn recommendations.  She stated that she just has some questions.  CB# [phone]

## 2020-12-23 NOTE — Telephone Encounter (Signed)
Please call patient let her know x ray of left ankle is at out patient imaging walk in.  I reffered her to Fraser Din at [facility] and she should get a call within 2 weeks.  Let me know if further questions.

## 2020-12-23 NOTE — Progress Notes (Signed)
Complete physical exam   Patient: Kim Mitchell   DOB: Sep 22, 1956   65 y.o. Female  MRN: [id number] Visit Date: 12/23/2020  Today's healthcare provider: Marcille Buffy, FNP   Chief Complaint  Patient presents with  . Annual Exam   Subjective    Kim Mitchell is a 65 y.o. female who presents today for a complete physical exam.   HPI  She reports consuming a general diet. The patient does not participate in regular exercise at present.   She generally feels well. She reports sleeping fairly well. She does have additional problems to discuss today. Patient would like to address today pain in her joints and vaginal pain with intercourse.  She has had painful intercourse since hysterectomy in 2011  And has  Tried multiple trials and has not improved seeing Gynecology.   Patient reports concerns of possible sinus pain above left eyebrow area.    Colonoscopy and endoscopy 2019 she believes, she had it done in [place] and due for repeat in 5 years.   Patient  denies any fever, body aches,chills, rash, chest pain, shortness of breath, nausea, vomiting, or diarrhea.  Denies dizziness, lightheadedness, pre syncopal or syncopal episodes.  Still has cervix.  Last PAP up to date with gynecology.   Past Medical History:  Diagnosis Date  . Fibroids    confrimed from medical records  . GERD (gastroesophageal reflux disease)   . Heart murmur    Past Surgical History:  Procedure Laterality Date  . BREAST CYST EXCISION Right 50 years ago   neg  . BREAST SURGERY    . LAPAROSCOPIC ASSISTED VAGINAL HYSTERECTOMY  11/18/2009  . TUBAL LIGATION     Social History   Socioeconomic History  . Marital status: Married    Spouse name: Not on file  . Number of children: Not on file  . Years of education: Not on file  . Highest education level: Not on file  Occupational History  . Not on file  Tobacco Use  . Smoking status: Never Smoker  . Smokeless tobacco: Never Used   Substance and Sexual Activity  . Alcohol use: Not Currently  . Drug use: Never  . Sexual activity: Not Currently  Other Topics Concern  . Not on file  Social History Narrative  . Not on file   Social Determinants of Health   Financial Resource Strain: Not on file  Food Insecurity: Not on file  Transportation Needs: Not on file  Physical Activity: Not on file  Stress: Not on file  Social Connections: Not on file  Intimate Partner Violence: Not on file   Family Status  Relation Name Status  . Mat Aunt  (Not Specified)  . MGM  (Not Specified)  . MGF  (Not Specified)  . Cousin  (Not Specified)   Family History  Problem Relation Age of Onset  . Cancer Maternal Aunt   . Hypertension Maternal Grandmother   . Cancer Maternal Grandfather   . Breast cancer Cousin    Allergies  Allergen Reactions  . Sulfa Antibiotics Swelling    Facial Swelling  . Demerol [Meperidine Hcl] Rash    Patient Care Team: Doreen Beam, FNP as PCP - General (Family Medicine)   Medications: Outpatient Medications Prior to Visit  Medication Sig  . Calcium Carbonate-Vitamin D 600-400 MG-UNIT tablet Take 1 tablet by mouth daily.  . Cholecalciferol (VITAMIN D3) 50 MCG (2000 UT) TABS Take by mouth.  . linaclotide (LINZESS) 145 MCG  CAPS capsule Take 145 mcg by mouth daily before breakfast.  . Multiple Vitamins-Minerals (CENTRUM SILVER PO) Take by mouth.   No facility-administered medications prior to visit.    Review of Systems  Constitutional: Negative.  Negative for diaphoresis.  HENT: Positive for congestion, postnasal drip, rhinorrhea, sinus pressure and sneezing.   Gastrointestinal: Positive for abdominal distention and constipation.  Genitourinary: Positive for dyspareunia. Negative for vaginal pain.  Musculoskeletal: Negative.  Negative for gait problem.  Psychiatric/Behavioral: Negative.   All other systems reviewed and are negative.     Objective    BP 127/81   Pulse 87    Temp 98 F (36.7 C) (Oral)   Resp 15   Wt 149 lb 9.6 oz (67.9 kg)   SpO2 100%   BMI 24.15 kg/m    Physical Exam Vitals reviewed.  Constitutional:      General: She is not in acute distress.    Appearance: She is well-developed. She is not diaphoretic.     Interventions: She is not intubated. HENT:     Head: Normocephalic and atraumatic.     Right Ear: External ear normal.     Left Ear: External ear normal.     Nose: Nose normal.     Mouth/Throat:     Pharynx: No oropharyngeal exudate or posterior oropharyngeal erythema.  Eyes:     General: Lids are normal. No scleral icterus.       Right eye: No discharge.        Left eye: No discharge.     Conjunctiva/sclera: Conjunctivae normal.     Right eye: Right conjunctiva is not injected. No exudate or hemorrhage.    Left eye: Left conjunctiva is not injected. No exudate or hemorrhage.    Pupils: Pupils are equal, round, and reactive to light.  Neck:     Thyroid: No thyroid mass or thyromegaly.     Vascular: Normal carotid pulses. No carotid bruit, hepatojugular reflux or JVD.     Trachea: Trachea and phonation normal. No tracheal tenderness or tracheal deviation.     Meningeal: Brudzinski's sign and Kernig's sign absent.  Cardiovascular:     Rate and Rhythm: Normal rate and regular rhythm.     Pulses: Normal pulses.          Radial pulses are 2+ on the right side and 2+ on the left side.       Dorsalis pedis pulses are 2+ on the right side and 2+ on the left side.       Posterior tibial pulses are 2+ on the right side and 2+ on the left side.     Heart sounds: Normal heart sounds, S1 normal and S2 normal. Heart sounds not distant. No murmur heard. No friction rub. No gallop.   Pulmonary:     Effort: Pulmonary effort is normal. No tachypnea, bradypnea, accessory muscle usage or respiratory distress. She is not intubated.     Breath sounds: Normal breath sounds. No stridor. No wheezing or rales.  Chest:     Chest wall: No  tenderness.  Breasts:     Right: No supraclavicular adenopathy.     Left: No supraclavicular adenopathy.    Abdominal:     General: Bowel sounds are normal. There is no distension or abdominal bruit.     Palpations: Abdomen is soft. There is no shifting dullness, fluid wave, hepatomegaly, splenomegaly, mass or pulsatile mass.     Tenderness: There is no abdominal tenderness. There is no guarding or  rebound.     Hernia: No hernia is present.  Musculoskeletal:        General: No tenderness or deformity. Normal range of motion.     Cervical back: Full passive range of motion without pain, normal range of motion and neck supple. No edema, erythema or rigidity. No spinous process tenderness or muscular tenderness. Normal range of motion.  Lymphadenopathy:     Head:     Right side of head: No submental, submandibular, tonsillar, preauricular, posterior auricular or occipital adenopathy.     Left side of head: No submental, submandibular, tonsillar, preauricular, posterior auricular or occipital adenopathy.     Cervical: No cervical adenopathy.     Right cervical: No superficial, deep or posterior cervical adenopathy.    Left cervical: No superficial, deep or posterior cervical adenopathy.     Upper Body:     Right upper body: No supraclavicular or pectoral adenopathy.     Left upper body: No supraclavicular or pectoral adenopathy.  Skin:    General: Skin is warm and dry.     Coloration: Skin is not pale.     Findings: No abrasion, bruising, burn, ecchymosis, erythema, lesion, petechiae or rash.     Nails: There is no clubbing.  Neurological:     Mental Status: She is alert and oriented to person, place, and time.     GCS: GCS eye subscore is 4. GCS verbal subscore is 5. GCS motor subscore is 6.     Cranial Nerves: No cranial nerve deficit.     Sensory: No sensory deficit.     Motor: No tremor, atrophy, abnormal muscle tone or seizure activity.     Coordination: Coordination normal.      Gait: Gait normal.     Deep Tendon Reflexes: Reflexes are normal and symmetric. Reflexes normal. Babinski sign absent on the right side. Babinski sign absent on the left side.     Reflex Scores:      Tricep reflexes are 2+ on the right side and 2+ on the left side.      Bicep reflexes are 2+ on the right side and 2+ on the left side.      Brachioradialis reflexes are 2+ on the right side and 2+ on the left side.      Patellar reflexes are 2+ on the right side and 2+ on the left side.      Achilles reflexes are 2+ on the right side and 2+ on the left side. Psychiatric:        Speech: Speech normal.        Behavior: Behavior normal.        Thought Content: Thought content normal.        Judgment: Judgment normal.     Last depression screening scores PHQ 2/9 Scores 12/23/2020 12/16/2019  PHQ - 2 Score 0 0  PHQ- 9 Score 4 2   Last fall risk screening Fall Risk  12/16/2019  Falls in the past year? 0  Number falls in past yr: 0  Injury with Fall? 0   Last Audit-C alcohol use screening Alcohol Use Disorder Test (AUDIT) 12/16/2019  1. How often do you have a drink containing alcohol? 1  2. How many drinks containing alcohol do you have on a typical day when you are drinking? 0  3. How often do you have six or more drinks on one occasion? 0  AUDIT-C Score 1   A score of 3 or more in women, and  4 or more in men indicates increased risk for alcohol abuse, EXCEPT if all of the points are from question 1   Results for orders placed or performed in visit on 12/23/20  CBC with Differential/Platelet  Result Value Ref Range   WBC 4.0 3.4 - 10.8 x10E3/uL   RBC 5.30 (H) 3.77 - 5.28 x10E6/uL   Hemoglobin 12.8 11.1 - 15.9 g/dL   Hematocrit 39.9 34.0 - 46.6 %   MCV 75 (L) 79 - 97 fL   MCH 24.2 (L) 26.6 - 33.0 pg   MCHC 32.1 31.5 - 35.7 g/dL   RDW 14.7 11.7 - 15.4 %   Platelets 270 150 - 450 x10E3/uL   Neutrophils 38 Not Estab. %   Lymphs 46 Not Estab. %   Monocytes 11 Not Estab. %   Eos 4 Not  Estab. %   Basos 1 Not Estab. %   Neutrophils Absolute 1.5 1.4 - 7.0 x10E3/uL   Lymphocytes Absolute 1.8 0.7 - 3.1 x10E3/uL   Monocytes Absolute 0.5 0.1 - 0.9 x10E3/uL   EOS (ABSOLUTE) 0.1 0.0 - 0.4 x10E3/uL   Basophils Absolute 0.0 0.0 - 0.2 x10E3/uL   Immature Granulocytes 0 Not Estab. %   Immature Grans (Abs) 0.0 0.0 - 0.1 x10E3/uL  Comprehensive Metabolic Panel (CMET)  Result Value Ref Range   Glucose 87 65 - 99 mg/dL   BUN 12 8 - 27 mg/dL   Creatinine, Ser 0.75 0.57 - 1.00 mg/dL   eGFR 89 >59 mL/min/1.73   BUN/Creatinine Ratio 16 12 - 28   Sodium 142 134 - 144 mmol/L   Potassium 5.2 3.5 - 5.2 mmol/L   Chloride 103 96 - 106 mmol/L   CO2 23 20 - 29 mmol/L   Calcium 9.6 8.7 - 10.3 mg/dL   Total Protein 7.8 6.0 - 8.5 g/dL   Albumin 4.5 3.8 - 4.8 g/dL   Globulin, Total 3.3 1.5 - 4.5 g/dL   Albumin/Globulin Ratio 1.4 1.2 - 2.2   Bilirubin Total 0.3 0.0 - 1.2 mg/dL   Alkaline Phosphatase 74 44 - 121 IU/L   AST 21 0 - 40 IU/L   ALT 12 0 - 32 IU/L  TSH  Result Value Ref Range   TSH 3.060 0.450 - 4.500 uIU/mL  Lipid Panel w/o Chol/HDL Ratio  Result Value Ref Range   Cholesterol, Total 174 100 - 199 mg/dL   Triglycerides 67 0 - 149 mg/dL   HDL 56 >39 mg/dL   VLDL Cholesterol Cal 13 5 - 40 mg/dL   LDL Chol Calc (NIH) 105 (H) 0 - 99 mg/dL  VITAMIN D 25 Hydroxy (Vit-D Deficiency, Fractures)  Result Value Ref Range   Vit D, 25-Hydroxy 42.3 30.0 - 100.0 ng/mL  ANA Direct w/Reflex if Positive  Result Value Ref Range   Anti Nuclear Antibody (ANA) Negative Negative  Rheumatoid factor  Result Value Ref Range   Rhuematoid fact SerPl-aCnc <10.0 <14.0 IU/mL  Iron, TIBC and Ferritin Panel  Result Value Ref Range   Total Iron Binding Capacity 299 250 - 450 ug/dL   UIBC 180 118 - 369 ug/dL   Iron 119 27 - 139 ug/dL   Iron Saturation 40 15 - 55 %   Ferritin 229 (H) 15 - 150 ng/mL  Specimen status report  Result Value Ref Range   specimen status report Comment   POCT Urinalysis Dip  Manual  Result Value Ref Range   Spec Grav, UA 1.010 1.010 - 1.025   pH, UA 5.0 5.0 -  8.0   Leukocytes, UA Negative Negative   Nitrite, UA Negative Negative   Poct Protein Negative Negative, trace mg/dL   Poct Glucose Normal Normal mg/dL   Poct Ketones Negative Negative   Poct Urobilinogen Normal Normal mg/dL   Poct Bilirubin Negative Negative   Poct Blood Negative Negative, trace    Assessment & Plan    Routine Health Maintenance and Physical Exam  Exercise Activities and Dietary recommendations Goals   None     Immunization History  Administered Date(s) Administered  . PFIZER(Purple Top)SARS-COV-2 Vaccination 12/27/2019, 01/21/2020    Health Maintenance  Topic Date Due  . COVID-19 Vaccine (3 - Booster for Pfizer series) 01/08/2021 (Originally 07/22/2020)  . INFLUENZA VACCINE  02/08/2021 (Originally 05/10/2020)  . COLONOSCOPY (Pts 45-62yr Insurance coverage will need to be confirmed)  04/22/2021 (Originally 09/20/2001)  . TETANUS/TDAP  12/23/2021 (Originally 09/21/1975)  . PAP SMEAR-Modifier  03/27/2021  . MAMMOGRAM  12/18/2021  . Hepatitis C Screening  Completed  . HIV Screening  Completed  . HPV VACCINES  Aged Out    Discussed health benefits of physical activity, and encouraged her to engage in regular exercise appropriate for her age and condition. Arthralgia of right hand- and left hand - Plan: CBC with Differential/Platelet, Comprehensive Metabolic Panel (CMET), TSH, Lipid Panel w/o Chol/HDL Ratio, ANA Direct w/Reflex if Positive, Rheumatoid factor  Vitamin D deficiency - Plan: VITAMIN D 25 Hydroxy (Vit-D Deficiency, Fractures)  History of partial hysterectomy - Plan: Ambulatory referral to Obstetrics / Gynecology  Screening for blood or protein in urine - Plan: POCT Urinalysis Dip Manual  Screening mammogram, encounter for - Plan: MM 3D SCREEN BREAST BILATERAL, CANCELED: MM Digital Screening  Seasonal allergies - Plan: fluticasone (FLONASE) 50 MCG/ACT nasal  spray, levocetirizine (XYZAL) 5 MG tablet  Acute left ankle pain - Plan: DG Ankle Complete Left  Orders Placed This Encounter  Procedures  . DG Ankle Complete Left  . MM 3D SCREEN BREAST BILATERAL  . CBC with Differential/Platelet  . Comprehensive Metabolic Panel (CMET)  . TSH  . Lipid Panel w/o Chol/HDL Ratio  . VITAMIN D 25 Hydroxy (Vit-D Deficiency, Fractures)  . ANA Direct w/Reflex if Positive  . Rheumatoid factor  . Iron, TIBC and Ferritin Panel  . Specimen status report  . Ambulatory referral to Obstetrics / Gynecology  . POCT Urinalysis Dip Manual   Meds ordered this encounter  Medications  . fluticasone (FLONASE) 50 MCG/ACT nasal spray    Sig: Place 2 sprays into both nostrils daily.    Dispense:  18.2 mL    Refill:  2  . levocetirizine (XYZAL) 5 MG tablet    Sig: Take 1 tablet (5 mg total) by mouth every evening.    Dispense:  90 tablet    Refill:  1   Return in about 6 months (around 06/25/2021), or if symptoms worsen or fail to improve, for Go to Emergency room/ urgent care if worse.    The entirety of the information documented in the History of Present Illness, Review of Systems and Physical Exam were personally obtained by me. Portions of this information were initially documented by the CMA and reviewed by me for thoroughness and accuracy.      MMarcille Buffy FE[phone](phone) [phone](fax)  [place]

## 2020-12-24 LAB — COMPREHENSIVE METABOLIC PANEL
ALT: 12 IU/L (ref 0–32)
AST: 21 IU/L (ref 0–40)
Albumin/Globulin Ratio: 1.4 (ref 1.2–2.2)
Albumin: 4.5 g/dL (ref 3.8–4.8)
Alkaline Phosphatase: 74 IU/L (ref 44–121)
BUN/Creatinine Ratio: 16 (ref 12–28)
BUN: 12 mg/dL (ref 8–27)
Bilirubin Total: 0.3 mg/dL (ref 0.0–1.2)
CO2: 23 mmol/L (ref 20–29)
Calcium: 9.6 mg/dL (ref 8.7–10.3)
Chloride: 103 mmol/L (ref 96–106)
Creatinine, Ser: 0.75 mg/dL (ref 0.57–1.00)
Globulin, Total: 3.3 g/dL (ref 1.5–4.5)
Glucose: 87 mg/dL (ref 65–99)
Potassium: 5.2 mmol/L (ref 3.5–5.2)
Sodium: 142 mmol/L (ref 134–144)
Total Protein: 7.8 g/dL (ref 6.0–8.5)
eGFR: 89 mL/min/{1.73_m2} (ref >59)

## 2020-12-24 LAB — CBC WITH DIFFERENTIAL/PLATELET
Basophils Absolute: 0 10*3/uL (ref 0.0–0.2)
Basos: 1 %
EOS (ABSOLUTE): 0.1 10*3/uL (ref 0.0–0.4)
Eos: 4 %
Hematocrit: 39.9 % (ref 34.0–46.6)
Hemoglobin: 12.8 g/dL (ref 11.1–15.9)
Immature Grans (Abs): 0 10*3/uL (ref 0.0–0.1)
Immature Granulocytes: 0 %
Lymphocytes Absolute: 1.8 10*3/uL (ref 0.7–3.1)
Lymphs: 46 %
MCH: 24.2 pg — ABNORMAL LOW (ref 26.6–33.0)
MCHC: 32.1 g/dL (ref 31.5–35.7)
MCV: 75 fL — ABNORMAL LOW (ref 79–97)
Monocytes Absolute: 0.5 10*3/uL (ref 0.1–0.9)
Monocytes: 11 %
Neutrophils Absolute: 1.5 10*3/uL (ref 1.4–7.0)
Neutrophils: 38 %
Platelets: 270 10*3/uL (ref 150–450)
RBC: 5.3 x10E6/uL — ABNORMAL HIGH (ref 3.77–5.28)
RDW: 14.7 % (ref 11.7–15.4)
WBC: 4 10*3/uL (ref 3.4–10.8)

## 2020-12-24 LAB — LIPID PANEL W/O CHOL/HDL RATIO
Cholesterol, Total: 174 mg/dL (ref 100–199)
HDL: 56 mg/dL (ref >39)
LDL Chol Calc (NIH): 105 mg/dL — ABNORMAL HIGH (ref 0–99)
Triglycerides: 67 mg/dL (ref 0–149)
VLDL Cholesterol Cal: 13 mg/dL (ref 5–40)

## 2020-12-24 LAB — ANA W/REFLEX IF POSITIVE: Anti Nuclear Antibody (ANA): NEGATIVE

## 2020-12-24 LAB — VITAMIN D 25 HYDROXY (VIT D DEFICIENCY, FRACTURES): Vit D, 25-Hydroxy: 42.3 ng/mL (ref 30.0–100.0)

## 2020-12-24 LAB — TSH: TSH: 3.06 u[IU]/mL (ref 0.450–4.500)

## 2020-12-24 LAB — RHEUMATOID FACTOR: Rheumatoid fact SerPl-aCnc: <10 IU/mL (ref <14.0)

## 2020-12-26 LAB — IRON,TIBC AND FERRITIN PANEL
Ferritin: 229 ng/mL — ABNORMAL HIGH (ref 15–150)
Iron Saturation: 40 % (ref 15–55)
Iron: 119 ug/dL (ref 27–139)
Total Iron Binding Capacity: 299 ug/dL (ref 250–450)
UIBC: 180 ug/dL (ref 118–369)

## 2020-12-26 LAB — SPECIMEN STATUS REPORT

## 2020-12-28 ENCOUNTER — Encounter: Payer: Self-pay | Admitting: Adult Health

## 2020-12-28 ENCOUNTER — Other Ambulatory Visit: Payer: Self-pay | Admitting: Adult Health

## 2020-12-28 DIAGNOSIS — M25572 Pain in left ankle and joints of left foot: Secondary | ICD-10-CM | POA: Insufficient documentation

## 2020-12-28 DIAGNOSIS — M799 Soft tissue disorder, unspecified: Secondary | ICD-10-CM

## 2020-12-28 DIAGNOSIS — M25541 Pain in joints of right hand: Secondary | ICD-10-CM | POA: Insufficient documentation

## 2020-12-28 HISTORY — DX: Pain in left ankle and joints of left foot: M25.572

## 2020-12-28 HISTORY — DX: Soft tissue disorder, unspecified: M79.9

## 2020-12-28 NOTE — Progress Notes (Signed)
Orders Placed This Encounter  Procedures  . [place] LT LOWER EXTREM LTD SOFT TISSUE NON VASCULAR  Soft tissue disorder of ankle - Plan: [place] LT LOWER EXTREM LTD SOFT TISSUE NON VASCULAR

## 2020-12-28 NOTE — Progress Notes (Signed)
Does have some soft tissue swelling over left ankle, no bone abnormality.recommend an ultrasound of that area and have placed an order.

## 2020-12-30 DIAGNOSIS — Z20822 Contact with and (suspected) exposure to covid-19: Secondary | ICD-10-CM | POA: Diagnosis not present

## 2020-12-31 DIAGNOSIS — Z03818 Encounter for observation for suspected exposure to other biological agents ruled out: Secondary | ICD-10-CM | POA: Diagnosis not present

## 2020-12-31 DIAGNOSIS — Z20822 Contact with and (suspected) exposure to covid-19: Secondary | ICD-10-CM | POA: Diagnosis not present

## 2021-01-13 ENCOUNTER — Ambulatory Visit
Admission: RE | Admit: 2021-01-13 | Discharge: 2021-01-13 | Disposition: A | Discharge status: Home / Self Care | Payer: Federal, State, Local not specified - PPO | Source: Ambulatory Visit | Attending: Adult Health | Admitting: Adult Health

## 2021-01-13 ENCOUNTER — Other Ambulatory Visit: Payer: Self-pay

## 2021-01-13 DIAGNOSIS — Z1231 Encounter for screening mammogram for malignant neoplasm of breast: Secondary | ICD-10-CM | POA: Diagnosis not present

## 2021-01-14 ENCOUNTER — Encounter: Payer: Self-pay | Admitting: Adult Health

## 2021-01-14 ENCOUNTER — Other Ambulatory Visit: Payer: Self-pay | Admitting: Adult Health

## 2021-01-14 DIAGNOSIS — R928 Other abnormal and inconclusive findings on diagnostic imaging of breast: Secondary | ICD-10-CM

## 2021-01-14 DIAGNOSIS — R921 Mammographic calcification found on diagnostic imaging of breast: Secondary | ICD-10-CM

## 2021-01-14 DIAGNOSIS — N6489 Other specified disorders of breast: Secondary | ICD-10-CM

## 2021-01-15 NOTE — Progress Notes (Signed)
Urine ok.  CBC with few minor irregularities, would recommend a multivitamin with iron in it and recheck CBC and TIBC iron in 3 months please add future labs. Add b12 as well. Be sure patient is having no rectal or vaginal bleeding ? CMP ok.  TSH ok for thyroid. LDL mildly  elevated.  Discuss lifestyle modification with patient e.g. increase exercise, fiber, fruits, vegetables, lean meat, and omega 3/fish intake and decrease saturated fat.  If patient following strict diet and exercise program already please schedule follow up appointment with primary care physician Vitamin D ok, can take Vitamin D 3 over the counter at 2,000 international units by mouth once daily.   ANA negative.  Rheumatoid factor is negative.

## 2021-01-15 NOTE — Progress Notes (Signed)
Breast center should be contacting you for a diagnostic mammogram as well as ultrasound, results from mammogram are below.   FINDINGS: In the right breast calcifications require further evaluation.  In the left breast distortion and asymmetry requires further evaluation.  IMPRESSION: Further evaluation is suggested for possible calcifications in the right breast.  Further evaluation is suggested for possible distortion and asymmetry in the left breast.  RECOMMENDATION: Diagnostic mammogram and possibly ultrasound of both breasts. (Code:[id number])  The patient will be contacted regarding the findings, and additional imaging will be scheduled.  BI-RADS CATEGORY  0: Incomplete. Need additional imaging evaluation and/or prior mammograms for comparison.   Electronically Signed   By: Lovey Newcomer M.D.   On: 01/14/2021 15:19

## 2021-01-15 NOTE — Progress Notes (Signed)
Kim Bryant [name], FNP  01/15/2021 3:04 PM EDT Back to Top   Urine ok.  CBC with few minor irregularities, would recommend a multivitamin with iron in it and recheck CBC and TIBC iron in 3 months please add future labs. Add b12 as well. Be sure patient is having no rectal or vaginal bleeding ? CMP ok.  TSH ok for thyroid. LDL mildly elevated. Discuss lifestyle modification with patient e.g. increase exercise, fiber, fruits, vegetables, lean meat, and omega 3/fish intake and decrease saturated fat. If patient following strict diet and exercise program already please schedule follow up appointment with primary care physician Vitamin D ok, can take Vitamin D 3 over the counter at 2,000 international units by mouth once daily.   ANA negative.  Rheumatoid factor is negative.   [place], FNP  01/15/2021 3:00 PM EDT    Breast center should be contacting you for a diagnostic mammogram as well as ultrasound, results from mammogram are below.   FINDINGS: In the right breast calcifications require further evaluation.  In the left breast distortion and asymmetry requires further evaluation.  IMPRESSION: Further evaluation is suggested for possible calcifications in the right breast.  Further evaluation is suggested for possible distortion and asymmetry in the left breast.  RECOMMENDATION: Diagnostic mammogram and possibly ultrasound of both breasts. (Code:[id number])  The patient will be contacted regarding the findings, and additional imaging will be scheduled.  BI-RADS CATEGORY 0: Incomplete. Need additional imaging evaluation and/or prior mammograms for comparison.   Electronically Signed By: Lovey Newcomer M.D. On: 01/14/2021 15:19

## 2021-01-20 ENCOUNTER — Other Ambulatory Visit: Payer: Self-pay

## 2021-01-20 ENCOUNTER — Ambulatory Visit
Admission: RE | Admit: 2021-01-20 | Discharge: 2021-01-20 | Disposition: A | Discharge status: Home / Self Care | Payer: Federal, State, Local not specified - PPO | Source: Ambulatory Visit | Attending: Adult Health | Admitting: Adult Health

## 2021-01-20 DIAGNOSIS — M7989 Other specified soft tissue disorders: Secondary | ICD-10-CM | POA: Diagnosis not present

## 2021-01-20 DIAGNOSIS — M799 Soft tissue disorder, unspecified: Secondary | ICD-10-CM | POA: Diagnosis not present

## 2021-01-21 ENCOUNTER — Encounter: Payer: Self-pay | Admitting: Obstetrics and Gynecology

## 2021-01-21 ENCOUNTER — Ambulatory Visit
Admission: RE | Admit: 2021-01-21 | Discharge: 2021-01-21 | Disposition: A | Discharge status: Home / Self Care | Payer: Federal, State, Local not specified - PPO | Source: Ambulatory Visit | Attending: Adult Health | Admitting: Adult Health

## 2021-01-21 ENCOUNTER — Ambulatory Visit: Admission: RE | Admit: 2021-01-21 | Payer: Federal, State, Local not specified - PPO | Source: Ambulatory Visit

## 2021-01-21 ENCOUNTER — Telehealth: Payer: Self-pay

## 2021-01-21 ENCOUNTER — Ambulatory Visit: Payer: Federal, State, Local not specified - PPO | Admitting: Obstetrics and Gynecology

## 2021-01-21 VITALS — BP 116/70 | Ht 65.0 in | Wt 152.6 lb

## 2021-01-21 DIAGNOSIS — N6489 Other specified disorders of breast: Secondary | ICD-10-CM | POA: Insufficient documentation

## 2021-01-21 DIAGNOSIS — R921 Mammographic calcification found on diagnostic imaging of breast: Secondary | ICD-10-CM | POA: Insufficient documentation

## 2021-01-21 DIAGNOSIS — R922 Inconclusive mammogram: Secondary | ICD-10-CM | POA: Diagnosis not present

## 2021-01-21 DIAGNOSIS — N941 Unspecified dyspareunia: Secondary | ICD-10-CM | POA: Diagnosis not present

## 2021-01-21 DIAGNOSIS — Z01419 Encounter for gynecological examination (general) (routine) without abnormal findings: Secondary | ICD-10-CM

## 2021-01-21 DIAGNOSIS — R928 Other abnormal and inconclusive findings on diagnostic imaging of breast: Secondary | ICD-10-CM | POA: Diagnosis not present

## 2021-01-21 DIAGNOSIS — N952 Postmenopausal atrophic vaginitis: Secondary | ICD-10-CM

## 2021-01-21 DIAGNOSIS — M799 Soft tissue disorder, unspecified: Secondary | ICD-10-CM

## 2021-01-21 NOTE — Progress Notes (Signed)
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 Please advise patient and place referral.

## 2021-01-21 NOTE — Patient Instructions (Signed)
Vaginal/ Vulvar Moisturizer Use 3-5 times a week at bedtime  Hyalo Gyn Revaree Replens Isaiah Blakes Key-E suppositories Vitamin E oil, olive oil, coconut oil  Water-based Lubricants  Astroglide [place] Jelly  Luvena Aquagel  Silicone- based Lubricants Pjur PINK Astroglide silicone Uberlube

## 2021-01-21 NOTE — Progress Notes (Signed)
Patient ID: Kim Mitchell, female   DOB: 06/20/1956, 65 y.o.   MRN: [id number]  Reason for Consult: Gynecologic Exam   Referred by Doreen Beam, F*  Subjective:     HPI:  Kim Mitchell is a 65 y.o. female,  she is uncertain if she retained her cervix and needs to continue pap smears.  She has been experiencing some discomfort with intercourse. She used a femring in the past and it help, but she had to discontinue because of the cost.   Gynecological History  No LMP recorded. Patient has had a hysterectomy. Menarche: 13 Menopause: 65  History of fibroids, polyps, or ovarian cysts? : yes, history of fibroid uterus  History of PCOS? no Hstory of Endometriosis? no History of abnormal pap smears? no Have you had any sexually transmitted infections in the past? no  Last Pap: Results were: 2019  She identifies as a female. She is sexually active with men.   She has dyspareunia. She denies postcoital bleeding.   Obstetrical History OB History  Gravida Para Term Preterm AB Living  2 2 2     2   SAB IAB Ectopic Multiple Live Births          2    # Outcome Date GA Lbr Len/2nd Weight Sex Delivery Anes PTL Lv  2 Term 02/01/84    M Vag-Spont   LIV  1 Term 03/19/76    [facility]     Past Medical History:  Diagnosis Date  . Fibroids    confrimed from medical records  . GERD (gastroesophageal reflux disease)   . Heart murmur    Family History  Problem Relation Age of Onset  . Cancer Maternal Aunt   . Hypertension Maternal Grandmother   . Cancer Maternal Grandfather   . Breast cancer Cousin    Past Surgical History:  Procedure Laterality Date  . BREAST CYST EXCISION Right 50 years ago   neg  . BREAST SURGERY    . LAPAROSCOPIC ASSISTED VAGINAL HYSTERECTOMY  11/18/2009  . TUBAL LIGATION      Short Social History:  Social History   Tobacco Use  . Smoking status: Never Smoker  . Smokeless tobacco: Never Used  Substance Use Topics  . Alcohol  use: Not Currently    Allergies  Allergen Reactions  . Sulfa Antibiotics Swelling    Facial Swelling  . Demerol [Meperidine Hcl] Rash    Current Outpatient Medications  Medication Sig Dispense Refill  . Calcium Carbonate-Vitamin D 600-400 MG-UNIT tablet Take 1 tablet by mouth daily.    . Cholecalciferol (VITAMIN D3) 50 MCG (2000 UT) TABS Take by mouth.    . fluticasone (FLONASE) 50 MCG/ACT nasal spray Place 2 sprays into both nostrils daily. 18.2 mL 2  . levocetirizine (XYZAL) 5 MG tablet Take 1 tablet (5 mg total) by mouth every evening. 90 tablet 1  . linaclotide (LINZESS) 145 MCG CAPS capsule Take 145 mcg by mouth daily before breakfast.    . Multiple Vitamins-Minerals (CENTRUM SILVER PO) Take by mouth.     No current facility-administered medications for this visit.    Review of Systems  Constitutional: Negative for chills, fatigue, fever and unexpected weight change.  HENT: Negative for trouble swallowing.  Eyes: Negative for loss of vision.  Respiratory: Negative for cough, shortness of breath and wheezing.  Cardiovascular: Negative for chest pain, leg swelling, palpitations and syncope.  GI: Negative for abdominal pain, blood in stool, diarrhea, nausea and  vomiting.  GU: Negative for difficulty urinating, dysuria, frequency and hematuria.  Musculoskeletal: Negative for back pain, leg pain and joint pain.  Skin: Negative for rash.  Neurological: Negative for dizziness, headaches, light-headedness, numbness and seizures.  Psychiatric: Negative for behavioral problem, confusion, depressed mood and sleep disturbance.        Objective:  Objective   Vitals:   01/21/21 1029  BP: 116/70  Weight: 152 lb 9.6 oz (69.2 kg)  Height: 5\' 5"  (1.651 m)   Body mass index is 25.39 kg/m.  Physical Exam Vitals and nursing note reviewed. Exam conducted with a chaperone present.  Constitutional:      Appearance: Normal appearance. She is well-developed.  HENT:     Head:  Normocephalic and atraumatic.  Eyes:     Extraocular Movements: Extraocular movements intact.     Pupils: Pupils are equal, round, and reactive to light.  Cardiovascular:     Rate and Rhythm: Normal rate and regular rhythm.  Pulmonary:     Effort: Pulmonary effort is normal. No respiratory distress.     Breath sounds: Normal breath sounds.  Abdominal:     General: Abdomen is flat.     Palpations: Abdomen is soft.  Genitourinary:    Comments: External: Normal appearing vulva. No lesions noted.  Speculum examination: Normal appearing vaginal cuff, no discharge  Bimanual examination: Uterus absent. No adnexal masses. No adnexal tenderness. Pelvis not fixed.  Breast exam: exam not performed Musculoskeletal:        General: No signs of injury.  Skin:    General: Skin is warm and dry.  Neurological:     Mental Status: She is alert and oriented to person, place, and time.  Psychiatric:        Behavior: Behavior normal.        Thought Content: Thought content normal.        Judgment: Judgment normal.     Assessment/Plan:     65 yo s/p hysterectomy 1. We reviewed indications for discontinuing pap smears. She denies a history of cervical neoplasia and abnormal pap smears. We reviewed her pathology report together which showed a normal cervix. No cervix seen on speculum exam. She retains her ovaries. Can discontinue pap smears but recommended to continue annual bimanual pelvic exam to access for adenxal masses.  2. Dyspareunia and vaginal atrophy - Reviewed options for vaginal moisturizers, provided patient with a list of both OTC and prescription items. She will consider and return if she would like to start a medication.   More than 30 minutes were spent face to face with the patient in the room, reviewing the medical record, labs and images, and coordinating care for the patient. The plan of management was discussed in detail and counseling was provided.     Adrian Prows  MD [facility], [facility] 01/21/2021 11:13 AM

## 2021-01-21 NOTE — Telephone Encounter (Signed)
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 Please advise patient and place referral.

## 2021-01-21 NOTE — Telephone Encounter (Signed)
Patient advised.KW 

## 2021-01-22 NOTE — Progress Notes (Signed)
IMPRESSION: 1. Indeterminate calcifications in the UPPER INNER QUADRANT of the RIGHT breast. 2. No persistent abnormality in the LEFT breast.  RECOMMENDATION: Stereotactic guided core biopsy of RIGHT breast calcifications.  I have discussed the findings and recommendations with the patient. If applicable, a reminder letter will be sent to the patient regarding the next appointment.  BI-RADS CATEGORY  4: Suspicious.   Electronically Signed   By: Nolon Nations M.D.   On: 01/21/2021 15:42

## 2021-01-25 ENCOUNTER — Other Ambulatory Visit: Payer: Self-pay | Admitting: Adult Health

## 2021-01-25 DIAGNOSIS — R928 Other abnormal and inconclusive findings on diagnostic imaging of breast: Secondary | ICD-10-CM

## 2021-01-25 DIAGNOSIS — R921 Mammographic calcification found on diagnostic imaging of breast: Secondary | ICD-10-CM

## 2021-01-26 ENCOUNTER — Other Ambulatory Visit: Payer: Self-pay | Admitting: Adult Health

## 2021-01-26 DIAGNOSIS — R921 Mammographic calcification found on diagnostic imaging of breast: Secondary | ICD-10-CM

## 2021-01-26 DIAGNOSIS — R928 Other abnormal and inconclusive findings on diagnostic imaging of breast: Secondary | ICD-10-CM

## 2021-01-29 ENCOUNTER — Other Ambulatory Visit: Payer: Self-pay

## 2021-01-29 ENCOUNTER — Ambulatory Visit
Admission: RE | Admit: 2021-01-29 | Discharge: 2021-01-29 | Disposition: A | Discharge status: Home / Self Care | Payer: Federal, State, Local not specified - PPO | Source: Ambulatory Visit | Attending: Adult Health | Admitting: Adult Health

## 2021-01-29 DIAGNOSIS — R920 Mammographic microcalcification found on diagnostic imaging of breast: Secondary | ICD-10-CM | POA: Diagnosis not present

## 2021-01-29 DIAGNOSIS — R928 Other abnormal and inconclusive findings on diagnostic imaging of breast: Secondary | ICD-10-CM | POA: Insufficient documentation

## 2021-01-29 DIAGNOSIS — R921 Mammographic calcification found on diagnostic imaging of breast: Secondary | ICD-10-CM | POA: Insufficient documentation

## 2021-01-29 HISTORY — PX: BREAST BIOPSY: SHX20

## 2021-02-01 NOTE — Progress Notes (Signed)
  IMPRESSION: Approximate 1.1 cm inferior migration of the X shaped clip relative to the site of biopsied calcifications. There are residual calcifications demonstrated.  Final Assessment: Post Procedure Mammograms for Marker Placement   Electronically Signed   By: Lovey Newcomer M.D.   On: 01/29/2021 09:23

## 2021-02-02 LAB — SURGICAL PATHOLOGY

## 2021-02-08 NOTE — Progress Notes (Signed)
Pathology results are inconclusive per Dr. Lovey Newcomer, and re-biopsy is recommended using stereotactic biopsy with tomographic technique (3D) procedure to obtain a definitive diagnosis of biopsied area.  Pathology results and recommendations below were discussed with patient by telephone on 02/03/2021. Patient reported biopsy site within normal limits with slight tenderness at the site, and no significant bruising. Post biopsy care instructions were reviewed, questions were answered and my direct phone number was provided to patient. Patient was instructed to call [facility] if any concerns or questions arise related to the biopsy.  Pathology report right breast biopsy see the following. Breast imaging center documents they have discussed with patient.  Sent to [facility].  Recommendation: Please schedule patient for RIGHT breast stereotactic biopsy with tomographic technique (3D) procedure of same group of calcifications biopsied on 01/29/2021. Allow 2 - 3 weeks before re-biopsy.  Pathology results reported by Electa Sniff RN on 02/05/2021.   Electronically Signed   By: Lovey Newcomer M.D.   On: 02/05/2021 12:31

## 2021-02-09 ENCOUNTER — Encounter (INDEPENDENT_AMBULATORY_CARE_PROVIDER_SITE_OTHER): Payer: Self-pay | Admitting: Vascular Surgery

## 2021-02-09 ENCOUNTER — Other Ambulatory Visit: Payer: Self-pay | Admitting: Adult Health

## 2021-02-09 ENCOUNTER — Encounter (INDEPENDENT_AMBULATORY_CARE_PROVIDER_SITE_OTHER): Payer: Federal, State, Local not specified - PPO | Admitting: Vascular Surgery

## 2021-02-09 ENCOUNTER — Encounter (INDEPENDENT_AMBULATORY_CARE_PROVIDER_SITE_OTHER): Payer: Self-pay

## 2021-02-09 DIAGNOSIS — R921 Mammographic calcification found on diagnostic imaging of breast: Secondary | ICD-10-CM

## 2021-02-09 DIAGNOSIS — R928 Other abnormal and inconclusive findings on diagnostic imaging of breast: Secondary | ICD-10-CM

## 2021-02-12 ENCOUNTER — Encounter: Payer: Self-pay | Admitting: Internal Medicine

## 2021-02-12 ENCOUNTER — Other Ambulatory Visit: Payer: Self-pay

## 2021-02-12 ENCOUNTER — Ambulatory Visit (INDEPENDENT_AMBULATORY_CARE_PROVIDER_SITE_OTHER): Payer: Federal, State, Local not specified - PPO | Admitting: Internal Medicine

## 2021-02-12 VITALS — BP 122/80 | HR 97 | Temp 98.5°F | Ht 67.05 in | Wt 151.4 lb

## 2021-02-12 DIAGNOSIS — L821 Other seborrheic keratosis: Secondary | ICD-10-CM

## 2021-02-12 DIAGNOSIS — R011 Cardiac murmur, unspecified: Secondary | ICD-10-CM

## 2021-02-12 DIAGNOSIS — Z23 Encounter for immunization: Secondary | ICD-10-CM | POA: Diagnosis not present

## 2021-02-12 DIAGNOSIS — Z0000 Encounter for general adult medical examination without abnormal findings: Secondary | ICD-10-CM | POA: Diagnosis not present

## 2021-02-12 DIAGNOSIS — I999 Unspecified disorder of circulatory system: Secondary | ICD-10-CM | POA: Diagnosis not present

## 2021-02-12 DIAGNOSIS — Z0100 Encounter for examination of eyes and vision without abnormal findings: Secondary | ICD-10-CM | POA: Diagnosis not present

## 2021-02-12 DIAGNOSIS — Z1283 Encounter for screening for malignant neoplasm of skin: Secondary | ICD-10-CM

## 2021-02-12 MED ORDER — TETANUS-DIPHTH-ACELL PERTUSSIS 5-2.5-18.5 LF-MCG/0.5 IM SUSP: 0.5000 mL | Freq: Once | INTRAMUSCULAR | 0 refills | Status: DC

## 2021-02-12 NOTE — Patient Instructions (Addendum)
[facility] Dr. Gala Romney  Dermatology-Dr. Melodie Bouillon or Dr. Lois Huxley in [place]   -->let me know who is in your network   Call [place] for schedule biopsy right breast again Patient was instructed to call [facility] if any concerns or questions arise related to the biopsy.  Recommendation: Please schedule patient for RIGHT breast stereotactic biopsy with tomographic technique (3D) procedure of same group of calcifications biopsied on 01/29/2021. Allow 2 - 3 weeks before re-biopsy.  Pathology results reported by Electa Sniff RN on 02/05/2021.   Electronically Signed   By: Lovey Newcomer M.D.   On: 02/05/2021 12:31  Tdap (Tetanus, Diphtheria, Pertussis) Vaccine: What You Need to Know 1. Why get vaccinated? Tdap vaccine can prevent tetanus, diphtheria, and pertussis. Diphtheria and pertussis spread from person to person. Tetanus enters the body through cuts or wounds.  TETANUS (T) causes painful stiffening of the muscles. Tetanus can lead to serious health problems, including being unable to open the mouth, having trouble swallowing and breathing, or death.  DIPHTHERIA (D) can lead to difficulty breathing, heart failure, paralysis, or death.  PERTUSSIS (aP), also known as "whooping cough," can cause uncontrollable, violent coughing that makes it hard to breathe, eat, or drink. Pertussis can be extremely serious especially in babies and young children, causing pneumonia, convulsions, brain damage, or death. In teens and adults, it can cause weight loss, loss of bladder control, passing out, and rib fractures from severe coughing. 2. Tdap vaccine Tdap is only for children 7 years and older, adolescents, and adults.  Adolescents should receive a single dose of Tdap, preferably at age 49 or 31 years. Pregnant people should get a dose of Tdap during every pregnancy, preferably during the early part of the third trimester, to help protect the newborn from pertussis.  Infants are most at risk for severe, life-threatening complications from pertussis. Adults who have never received Tdap should get a dose of Tdap. Also, adults should receive a booster dose of either Tdap or Td (a different vaccine that protects against tetanus and diphtheria but not pertussis) every 10 years, or after 5 years in the case of a severe or dirty wound or burn. Tdap may be given at the same time as other vaccines. 3. Talk with your health care provider Tell your vaccine provider if the person getting the vaccine:  Has had an allergic reaction after a previous dose of any vaccine that protects against tetanus, diphtheria, or pertussis, or has any severe, life-threatening allergies  Has had a coma, decreased level of consciousness, or prolonged seizures within 7 days after a previous dose of any pertussis vaccine (DTP, DTaP, or Tdap)  Has seizures or another nervous system problem  Has ever had Guillain-Barr Syndrome (also called "GBS")  Has had severe pain or swelling after a previous dose of any vaccine that protects against tetanus or diphtheria In some cases, your health care provider may decide to postpone Tdap vaccination until a future visit. People with minor illnesses, such as a cold, may be vaccinated. People who are moderately or severely ill should usually wait until they recover before getting Tdap vaccine.  Your health care provider can give you more information. 4. Risks of a vaccine reaction  Pain, redness, or swelling where the shot was given, mild fever, headache, feeling tired, and nausea, vomiting, diarrhea, or stomachache sometimes happen after Tdap vaccination. People sometimes faint after medical procedures, including vaccination. Tell your provider if you feel dizzy or have vision  changes or ringing in the ears.  As with any medicine, there is a very remote chance of a vaccine causing a severe allergic reaction, other serious injury, or death. 5. What if  there is a serious problem? An allergic reaction could occur after the vaccinated person leaves the clinic. If you see signs of a severe allergic reaction (hives, swelling of the face and throat, difficulty breathing, a fast heartbeat, dizziness, or weakness), call 9-1-1 and get the person to the nearest hospital. For other signs that concern you, call your health care provider.  Adverse reactions should be reported to the Vaccine Adverse Event Reporting System (VAERS). Your health care provider will usually file this report, or you can do it yourself. Visit the VAERS website at www.vaers.SamedayNews.es or call 276-182-1771. VAERS is only for reporting reactions, and VAERS staff members do not give medical advice. 6. The National Vaccine Injury Compensation Program The [employer] Vaccine Injury Compensation Program (VICP) is a federal program that was created to compensate people who may have been injured by certain vaccines. Claims regarding alleged injury or death due to vaccination have a time limit for filing, which may be as short as two years. Visit the VICP website at GoldCloset.com.ee or call 307-612-3607 to learn about the program and about filing a claim. 7. How can I learn more?  Ask your health care provider.  Call your local or state health department.  Visit the website of the Food and Drug Administration (FDA) for vaccine package inserts and additional information at TraderRating.uy.  Contact the Centers for Disease Control and Prevention (CDC): ? Call 7156365481 (1-800-CDC-INFO) or ? Visit CDC's website at http://hunter.com/. Vaccine Information Statement Tdap (Tetanus, Diphtheria, Pertussis) Vaccine (05/15/2020) This information is not intended to replace advice given to you by your health care provider. Make sure you  High Cholesterol  High cholesterol is a condition in which the blood has high levels of a white, waxy substance  similar to fat (cholesterol). The liver makes all the cholesterol that the body needs. The human body needs small amounts of cholesterol to help build cells. A person gets extra or excess cholesterol from the food that he or she eats. The blood carries cholesterol from the liver to the rest of the body. If you have high cholesterol, deposits (plaques) may build up on the walls of your arteries. Arteries are the blood vessels that carry blood away from your heart. These plaques make the arteries narrow and stiff. Cholesterol plaques increase your risk for heart attack and stroke. Work with your health care provider to keep your cholesterol levels in a healthy range. What increases the risk? The following factors may make you more likely to develop this condition:  Eating foods that are high in animal fat (saturated fat) or cholesterol.  Being overweight.  Not getting enough exercise.  A family history of high cholesterol (familial hypercholesterolemia).  Use of tobacco products.  Having diabetes. What are the signs or symptoms? There are no symptoms of this condition. How is this diagnosed? This condition may be diagnosed based on the results of a blood test.  If you are older than 65 years of age, your health care provider may check your cholesterol levels every 4-6 years.  You may be checked more often if you have high cholesterol or other risk factors for heart disease. The blood test for cholesterol measures:  "Bad" cholesterol, or LDL cholesterol. This is the main type of cholesterol that causes heart disease. The desired level  is less than 100 mg/dL.  "Good" cholesterol, or HDL cholesterol. HDL helps protect against heart disease by cleaning the arteries and carrying the LDL to the liver for processing. The desired level for HDL is 60 mg/dL or higher.  Triglycerides. These are fats that your body can store or burn for energy. The desired level is less than 150 mg/dL.  Total  cholesterol. This measures the total amount of cholesterol in your blood and includes LDL, HDL, and triglycerides. The desired level is less than 200 mg/dL. How is this treated? This condition may be treated with:  Diet changes. You may be asked to eat foods that have more fiber and less saturated fats or added sugar.  Lifestyle changes. These may include regular exercise, maintaining a healthy weight, and quitting use of tobacco products.  Medicines. These are given when diet and lifestyle changes have not worked. You may be prescribed a statin medicine to help lower your cholesterol levels. Follow these instructions at home: Eating and drinking  Eat a healthy, balanced diet. This diet includes: ? Daily servings of a variety of fresh, frozen, or canned fruits and vegetables. ? Daily servings of whole grain foods that are rich in fiber. ? Foods that are low in saturated fats and trans fats. These include poultry and fish without skin, lean cuts of meat, and low-fat dairy products. ? A variety of fish, especially oily fish that contain omega-3 fatty acids. Aim to eat fish at least 2 times a week.  Avoid foods and drinks that have added sugar.  Use healthy cooking methods, such as roasting, grilling, broiling, baking, poaching, steaming, and stir-frying. Do not fry your food except for stir-frying.   Lifestyle  Get regular exercise. Aim to exercise for a total of 150 minutes a week. Increase your activity level by doing activities such as gardening, walking, and taking the stairs.  Do not use any products that contain nicotine or tobacco, such as cigarettes, e-cigarettes, and chewing tobacco. If you need help quitting, ask your health care provider.   General instructions  Take over-the-counter and prescription medicines only as told by your health care provider.  Keep all follow-up visits as told by your health care provider. This is important. Where to find more information  American  Heart Association: www.heart.org  National Heart, Lung, and Blood Institute: https://wilson-eaton.com/ Contact a health care provider if:  You have trouble achieving or maintaining a healthy diet or weight.  You are starting an exercise program.  You are unable to stop smoking. Get help right away if:  You have chest pain.  You have trouble breathing.  You have any symptoms of a stroke. "BE FAST" is an easy way to remember the main warning signs of a stroke: ? B - Balance. Signs are dizziness, sudden trouble walking, or loss of balance. ? E - Eyes. Signs are trouble seeing or a sudden change in vision. ? F - Face. Signs are sudden weakness or numbness of the face, or the face or eyelid drooping on one side. ? A - Arms. Signs are weakness or numbness in an arm. This happens suddenly and usually on one side of the body. ? S - Speech. Signs are sudden trouble speaking, slurred speech, or trouble understanding what people say. ? T - Time. Time to call emergency services. Write down what time symptoms started.  You have other signs of a stroke, such as: ? A sudden, severe headache with no known cause. ? Nausea or vomiting. ?  Seizure. These symptoms may represent a serious problem that is an emergency. Do not wait to see if the symptoms will go away. Get medical help right away. Call your local emergency services (911 in the [place] Do not drive yourself to the hospital. Summary  Cholesterol plaques increase your risk for heart attack and stroke. Work with your health care provider to keep your cholesterol levels in a healthy range.  Eat a healthy, balanced diet, get regular exercise, and maintain a healthy weight.  Do not use any products that contain nicotine or tobacco, such as cigarettes, e-cigarettes, and chewing tobacco.  Get help right away if you have any symptoms of a stroke. This information is not intended to replace advice given to you by your health care provider. Make sure you  discuss any questions you have with your health care provider. Document Revised: 08/26/2019 Document Reviewed: 08/26/2019 Elsevier Patient Education  2021 Welcome.   Cholesterol Content in Foods Cholesterol is a waxy, fat-like substance that helps to carry fat in the blood. The body needs cholesterol in small amounts, but too much cholesterol can cause damage to the arteries and heart. Most people should eat less than 200 milligrams (mg) of cholesterol a day. Foods with cholesterol Cholesterol is found in animal-based foods, such as meat, seafood, and dairy. Generally, low-fat dairy and lean meats have less cholesterol than full-fat dairy and fatty meats. The milligrams of cholesterol per serving (mg per serving) of common cholesterol-containing foods are listed below. Meat and other proteins  Egg -- one large whole egg has 186 mg.  Veal shank -- 4 oz has 141 mg.  Lean ground [place] (93% lean) -- 4 oz has 118 mg.  Fat-trimmed lamb loin -- 4 oz has 106 mg.  Lean ground beef (90% lean) -- 4 oz has 100 mg.  Lobster -- 3.5 oz has 90 mg.  Pork loin chops -- 4 oz has 86 mg.  Canned salmon -- 3.5 oz has 83 mg.  Fat-trimmed beef top loin -- 4 oz has 78 mg.  Frankfurter -- 1 frank (3.5 oz) has 77 mg.  Crab -- 3.5 oz has 71 mg.  Roasted chicken without skin, white meat -- 4 oz has 66 mg.  Light bologna -- 2 oz has 45 mg.  Deli-cut [place] -- 2 oz has 31 mg.  Canned tuna -- 3.5 oz has 31 mg.  Berniece Salines -- 1 oz has 29 mg.  Oysters and mussels (raw) -- 3.5 oz has 25 mg.  Mackerel -- 1 oz has 22 mg.  Trout -- 1 oz has 20 mg.  Pork sausage -- 1 link (1 oz) has 17 mg.  Salmon -- 1 oz has 16 mg.  Tilapia -- 1 oz has 14 mg. Dairy  Soft-serve ice cream --  cup (4 oz) has 103 mg.  Whole-milk yogurt -- 1 cup (8 oz) has 29 mg.  Cheddar cheese -- 1 oz has 28 mg.  American cheese -- 1 oz has 28 mg.  Whole milk -- 1 cup (8 oz) has 23 mg.  2% milk -- 1 cup (8 oz) has 18  mg.  Cream cheese -- 1 tablespoon (Tbsp) has 15 mg.  Cottage cheese --  cup (4 oz) has 14 mg.  Low-fat (1%) milk -- 1 cup (8 oz) has 10 mg.  Sour cream -- 1 Tbsp has 8.5 mg.  Low-fat yogurt -- 1 cup (8 oz) has 8 mg.  Nonfat Greek yogurt -- 1 cup (8 oz) has  7 mg.  Half-and-half cream -- 1 Tbsp has 5 mg. Fats and oils  Cod liver oil -- 1 tablespoon (Tbsp) has 82 mg.  Butter -- 1 Tbsp has 15 mg.  Lard -- 1 Tbsp has 14 mg.  Bacon grease -- 1 Tbsp has 14 mg.  Mayonnaise -- 1 Tbsp has 5-10 mg.  Margarine -- 1 Tbsp has 3-10 mg. Exact amounts of cholesterol in these foods may vary depending on specific ingredients and brands.   Foods without cholesterol Most plant-based foods do not have cholesterol unless you combine them with a food that has cholesterol. Foods without cholesterol include:  Grains and cereals.  Vegetables.  Fruits.  Vegetable oils, such as olive, canola, and sunflower oil.  Legumes, such as peas, beans, and lentils.  Nuts and seeds.  Egg whites.   Summary  The body needs cholesterol in small amounts, but too much cholesterol can cause damage to the arteries and heart.  Most people should eat less than 200 milligrams (mg) of cholesterol a day. This information is not intended to replace advice given to you by your health care provider. Make sure you discuss any questions you have with your health care provider. Document Revised: 02/17/2020 Document Reviewed: 02/17/2020 Elsevier Patient Education  2021 [employer].    discuss any questions you have with your health care provider. Document Revised: 06/10/2020 Document Reviewed: 06/10/2020 Elsevier Patient Education  2021 [employer].

## 2021-02-12 NOTE — Progress Notes (Signed)
Chief Complaint  Patient presents with  . Establish Care   New patient/annual husband is also my patient  1. She would like to establish new eye doctor referred to [facility] 2. Growths on face will refer to dermatology for consult sks vs dpns she has had removed before but lesions came back  3. 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  HENT: Negative for hearing loss.   Eyes: Negative for blurred vision.  Respiratory: Negative for shortness of breath.   Cardiovascular: Negative for chest pain.  Gastrointestinal: Negative for abdominal pain.  Musculoskeletal: Negative for falls and joint pain.  Skin: Negative for rash.  Neurological: Negative for headaches.  Psychiatric/Behavioral: Negative for depression.   Past Medical History:  Diagnosis Date  . Fibroids    confrimed from medical records  . GERD (gastroesophageal reflux disease)   . Heart murmur   . Sickle cell trait [facility])    Past Surgical History:  Procedure Laterality Date  . BREAST BIOPSY Right 01/29/2021   Affirm bx-"X" clip-path pending  . BREAST CYST EXCISION Right 50 years ago ~1970   neg  . BREAST SURGERY    . LAPAROSCOPIC ASSISTED VAGINAL HYSTERECTOMY  11/18/2009   partial hysterectomy only ovaries left no h/o h/o abnormal pap   . TUBAL LIGATION     1985   Family History  Problem Relation Age of Onset  . Cancer Maternal Aunt        ?lung  . Hypertension Maternal Grandmother   . Cancer Maternal Grandfather        ?type  . Breast cancer Cousin   . Cancer Cousin        uterine  . Cancer Sister        jaw   . Heart attack Brother   . Hypertension Other        siblings   . Cancer Brother        GU cancer  . Cancer Maternal Aunt        ?lung   Social History   Socioeconomic History  . Marital status: Married    Spouse name: Not on file  . Number of children: Not on file  . Years of education: Not on file  .  Highest education level: Not on file  Occupational History  . Not on file  Tobacco Use  . Smoking status: Never Smoker  . Smokeless tobacco: Never Used  Substance and Sexual Activity  . Alcohol use: Not Currently  . Drug use: Never  . Sexual activity: Not Currently  Other Topics Concern  . Not on file  Social History Narrative   7 siblings, 4 living as of 02/12/21    Retired Producer, television/film/video    2 female kids 45, 93 02/12/21    Grew up [place] moved to [employer] 2nd grade relocated here [place] Strain: Not on file  Food Insecurity: Not on file  Transportation Needs: Not on file  Physical Activity: Not on file  Stress: Not on file  Social Connections: Not on file  Intimate Partner Violence: Not on file   Current Meds  Medication Sig  . Calcium Carbonate-Vitamin D 600-400 MG-UNIT tablet Take 1 tablet by mouth daily.  . fluticasone (FLONASE) 50 MCG/ACT nasal spray Place 2 sprays into both nostrils daily.  . Iron, Ferrous Sulfate, 325 (65 Fe) MG  TABS   . levocetirizine (XYZAL) 5 MG tablet Take 1 tablet (5 mg total) by mouth every evening.  . linaclotide (LINZESS) 145 MCG CAPS capsule Take 145 mcg by mouth daily before breakfast.  . Multiple Vitamins-Minerals (CENTRUM SILVER PO) Take by mouth.  . [DISCONTINUED] Tdap (BOOSTRIX) 5-2.5-18.5 LF-MCG/0.5 injection Inject 0.5 mLs into the muscle once for 1 dose.   Allergies  Allergen Reactions  . Sulfa Antibiotics Swelling    Facial Swelling  . Demerol [Meperidine Hcl] Rash   Recent Results (from the past 2160 hour(s))  CBC with Differential/Platelet     Status: Abnormal   Collection Time: 12/23/20  1:50 PM  Result Value Ref Range   WBC 4.0 3.4 - 10.8 x10E3/uL   RBC 5.30 (H) 3.77 - 5.28 x10E6/uL   Hemoglobin 12.8 11.1 - 15.9 g/dL   Hematocrit 39.9 34.0 - 46.6 %   MCV 75 (L) 79 - 97 fL   MCH 24.2 (L) 26.6 - 33.0 pg   MCHC 32.1 31.5 - 35.7 g/dL   RDW 14.7 11.7 - 15.4 %    Platelets 270 150 - 450 x10E3/uL   Neutrophils 38 Not Estab. %   Lymphs 46 Not Estab. %   Monocytes 11 Not Estab. %   Eos 4 Not Estab. %   Basos 1 Not Estab. %   Neutrophils Absolute 1.5 1.4 - 7.0 x10E3/uL   Lymphocytes Absolute 1.8 0.7 - 3.1 x10E3/uL   Monocytes Absolute 0.5 0.1 - 0.9 x10E3/uL   EOS (ABSOLUTE) 0.1 0.0 - 0.4 x10E3/uL   Basophils Absolute 0.0 0.0 - 0.2 x10E3/uL   Immature Granulocytes 0 Not Estab. %   Immature Grans (Abs) 0.0 0.0 - 0.1 x10E3/uL  Comprehensive Metabolic Panel (CMET)     Status: None   Collection Time: 12/23/20  1:50 PM  Result Value Ref Range   Glucose 87 65 - 99 mg/dL   BUN 12 8 - 27 mg/dL   Creatinine, Ser 0.75 0.57 - 1.00 mg/dL   eGFR 89 >59 mL/min/1.73   BUN/Creatinine Ratio 16 12 - 28   Sodium 142 134 - 144 mmol/L   Potassium 5.2 3.5 - 5.2 mmol/L   Chloride 103 96 - 106 mmol/L   CO2 23 20 - 29 mmol/L   Calcium 9.6 8.7 - 10.3 mg/dL   Total Protein 7.8 6.0 - 8.5 g/dL   Albumin 4.5 3.8 - 4.8 g/dL   Globulin, Total 3.3 1.5 - 4.5 g/dL   Albumin/Globulin Ratio 1.4 1.2 - 2.2   Bilirubin Total 0.3 0.0 - 1.2 mg/dL   Alkaline Phosphatase 74 44 - 121 IU/L   AST 21 0 - 40 IU/L   ALT 12 0 - 32 IU/L  TSH     Status: None   Collection Time: 12/23/20  1:50 PM  Result Value Ref Range   TSH 3.060 0.450 - 4.500 uIU/mL  Lipid Panel w/o Chol/HDL Ratio     Status: Abnormal   Collection Time: 12/23/20  1:50 PM  Result Value Ref Range   Cholesterol, Total 174 100 - 199 mg/dL   Triglycerides 67 0 - 149 mg/dL   HDL 56 >39 mg/dL   VLDL Cholesterol Cal 13 5 - 40 mg/dL   LDL Chol Calc (NIH) 105 (H) 0 - 99 mg/dL  VITAMIN D 25 Hydroxy (Vit-D Deficiency, Fractures)     Status: None   Collection Time: 12/23/20  1:50 PM  Result Value Ref Range   Vit D, 25-Hydroxy 42.3 30.0 - 100.0 ng/mL  Comment: Vitamin D deficiency has been defined by the [place] practice guideline as a level of serum 25-OH vitamin D less than 20  ng/mL (1,2). The Endocrine Society went on to further define vitamin D insufficiency as a level between 21 and 29 ng/mL (2). 1. IOM (Institute of Medicine). 2010. Dietary reference    intakes for calcium and D. [place]: The    [employer]. 2. Holick MF, Binkley Shoshoni, Bischoff-Ferrari HA, et al.    Evaluation, treatment, and prevention of vitamin D    deficiency: an Endocrine Society clinical practice    guideline. JCEM. 2011 Jul; 96(7):1911-30.   ANA Direct w/Reflex if Positive     Status: None   Collection Time: 12/23/20  1:50 PM  Result Value Ref Range   Anti Nuclear Antibody (ANA) Negative Negative  Rheumatoid factor     Status: None   Collection Time: 12/23/20  1:50 PM  Result Value Ref Range   Rhuematoid fact SerPl-aCnc <10.0 <14.0 IU/mL  Iron, TIBC and Ferritin Panel     Status: Abnormal   Collection Time: 12/23/20  1:50 PM  Result Value Ref Range   Total Iron Binding Capacity 299 250 - 450 ug/dL   UIBC 180 118 - 369 ug/dL   Iron 119 27 - 139 ug/dL   Iron Saturation 40 15 - 55 %   Ferritin 229 (H) 15 - 150 ng/mL  Specimen status report     Status: None   Collection Time: 12/23/20  1:50 PM  Result Value Ref Range   specimen status report Comment     Comment: Written Authorization Written Authorization Written Authorization Received. Authorization received from [facility] 12-25-2020 Logged by Marcene Duos   POCT Urinalysis Dip Manual     Status: Normal   Collection Time: 12/23/20  2:43 PM  Result Value Ref Range   Spec Grav, UA 1.010 1.010 - 1.025   pH, UA 5.0 5.0 - 8.0   Leukocytes, UA Negative Negative   Nitrite, UA Negative Negative   Poct Protein Negative Negative, trace mg/dL   Poct Glucose Normal Normal mg/dL   Poct Ketones Negative Negative   Poct Urobilinogen Normal Normal mg/dL   Poct Bilirubin Negative Negative   Poct Blood Negative Negative, trace  Surgical pathology     Status: None   Collection Time: 01/29/21  9:21 AM   Result Value Ref Range   SURGICAL PATHOLOGY      SURGICAL PATHOLOGY CASE: [phone] [phone] PATIENT: Vernell Barrier Surgical Pathology Report     Specimen Submitted: A. Breast, right, upper inner quadrant; biopsy  Clinical History: Right breast upper inner quadrant with 3 mm group of indeterminate calcifications. Impression: fibrocystic disease vs fibroadenoma-type vs DCIS. Post biopsy mammograms show the biopsy marking clip located approximately 1.1 cm inferior to the site of biopsied calcifications     DIAGNOSIS: A. BREAST, RIGHT, UPPER INNER QUADRANT; STEREOTACTIC-GUIDED CORE BIOPSY: - BENIGN FIBROADIPOSE TISSUE CONTAINING ONE CALCIFICATION.  Comment: Additional deeper sections of blocks A1 (sector A) and A2 (sector H) were reviewed. In A2 one 0.3 mm calcification is recovered, not associated with an identifiable epithelial structure.  Correlation with all imaging findings is recommended to be sure the sample contains the area of concern.  GROSS DESCRIPTION: A. Labeled: Right breast stereo biopsy ca lcs upper inner quadrant Received: in a formalin-filled [place] collection device Specimen radiograph image(s) available for review Time/Date in fixative: Collected at 8:49 AM on 01/29/2021 and placed in formalin  at 8:50 AM on 01/29/2021 Cold ischemic time: Less than 5 minutes Total fixation time: 10.5 hours Core pieces: Multiple Measurement: Aggregate, 6.5 x 1.4 x 0.5 cm Description / comments: Received are cores and fragments of yellow fibrofatty tissue.  The accompanying diagram as sections A and H checked. Inked: Blue Entirely submitted in cassette(s):  1 - section A 2 - section H 3 - 6 - remaining tissue fragments      3 - sections B and C      4 - sections D and E      5 - sections F and G      6 - sections I, J, and K  RB 01/29/2021  Final Diagnosis performed by Bryan Lemma, MD.   Electronically signed 02/02/2021 12:27:46PM The electronic signature  indicates that the named Attending Pathologist has evaluated the specimen Technical component performed at [place], [address], [place], [place] [address] Lab: [phone] Dir: Rush Farmer, MD, MMM  Professional component performed at [facility], [facility], [place], [place], [place] [address] Lab: [phone] Dir: Dellia Nims. Rubinas, MD    Objective  Body mass index is 23.68 kg/m. Wt Readings from Last 3 Encounters:  02/16/21 153 lb (69.4 kg)  02/12/21 151 lb 6.4 oz (68.7 kg)  01/21/21 152 lb 9.6 oz (69.2 kg)   Temp Readings from Last 3 Encounters:  02/12/21 98.5 F (36.9 C) (Oral)  12/23/20 98 F (36.7 C) (Oral)  12/16/19 97.9 F (36.6 C) (Temporal)   BP Readings from Last 3 Encounters:  02/16/21 136/79  02/12/21 122/80  01/21/21 116/70   Pulse Readings from Last 3 Encounters:  02/16/21 80  02/12/21 97  12/23/20 87    Physical Exam Vitals and nursing note reviewed.  Constitutional:      Appearance: Normal appearance. She is well-developed and well-groomed.  HENT:     Head: Normocephalic and atraumatic.  Cardiovascular:     Rate and Rhythm: Normal rate and regular rhythm.     Heart sounds: Normal heart sounds.     Comments: No obvious murmur heard  Pulmonary:     Effort: Pulmonary effort is normal.     Breath sounds: Normal breath sounds.  Abdominal:     General: Abdomen is flat. Bowel sounds are normal.     Tenderness: There is no abdominal tenderness.  Skin:    General: Skin is warm and dry.  Neurological:     General: No focal deficit present.     Mental Status: She is alert and oriented to person, place, and time. Mental status is at baseline.     Gait: Gait normal.  Psychiatric:        Attention and Perception: Attention and perception normal.        Mood and Affect: Mood and affect normal.        Speech: Speech normal.        Behavior: Behavior normal. Behavior is cooperative.        Thought Content: Thought content  normal.        Cognition and Memory: Cognition and memory normal.        Judgment: Judgment normal.     Assessment  Plan  Annual physical exam Not had flu shot since 2017  Tdap utd had 02/18/21 after visit Consider shingrix disc prevnar and pna 23  Pfizer 3/3 consider booster   Pap 4 years ago and ob/gyn stated in 2022 no further paps p. Hysterectomy fibroids ovaries intact no  h/o abnormal pap  -[facility] seen 01/21/21  Colonoscopy/EGD 2019 ROI sent prior PCP Dr. Posey Pronto had in [place]   mammo right bx 01/29/21 rec re bx calcification, rec re-bx in 2-3 weeks pt wants this done in 1 month right breast re-bx sch 5/24/2 re bx of breast right   dexa consider age 70   Hep C negative   ROI sent prior PCP Dr. Posey Pronto in [place] need colonoscopy, echo  rec healthy diet and exercise   Vascular abnormality 01/20/21 left ankle [place] Pending f/u vascular  IMPRESSION: Nonspecific 3.0 x 1.7 x 0.7 cm hypoechoic and septated lesion corresponding to the patient identified site of palpable abnormality, deformable with compression and demonstrating some evidence of internal Doppler flow. 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  Skin cancer screening Seborrheic keratosis - Plan: Ambulatory referral to Dermatology Dr. 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 Lanier Prude  Mammogram [place]   Provider: Dr. Olivia Mackie McLean-Scocuzza-Internal Medicine

## 2021-02-16 ENCOUNTER — Encounter (INDEPENDENT_AMBULATORY_CARE_PROVIDER_SITE_OTHER): Payer: Self-pay | Admitting: Vascular Surgery

## 2021-02-16 ENCOUNTER — Ambulatory Visit (INDEPENDENT_AMBULATORY_CARE_PROVIDER_SITE_OTHER): Payer: Federal, State, Local not specified - PPO | Admitting: Vascular Surgery

## 2021-02-16 ENCOUNTER — Other Ambulatory Visit: Payer: Self-pay

## 2021-02-16 VITALS — BP 136/79 | HR 80 | Resp 16 | Ht 66.5 in | Wt 153.0 lb

## 2021-02-16 DIAGNOSIS — K219 Gastro-esophageal reflux disease without esophagitis: Secondary | ICD-10-CM | POA: Diagnosis not present

## 2021-02-16 DIAGNOSIS — M799 Soft tissue disorder, unspecified: Secondary | ICD-10-CM

## 2021-02-16 DIAGNOSIS — M7989 Other specified soft tissue disorders: Secondary | ICD-10-CM | POA: Diagnosis not present

## 2021-02-16 NOTE — Progress Notes (Signed)
Patient ID: Kim Mitchell, female   DOB: 1956/09/23, 65 y.o.   MRN: [id number]  Chief Complaint  Patient presents with  . 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 female.  I am asked to see the patient by M. Flinchum for evaluation of a small round mass just above the left lateral ankle anteriorly.  This is been present for a couple of years and has been slowly growing.  Initially, there was some varicose veins in the area and question has been raised whether or not this could be some sort of vascular malformation or lymphovascular malformation.  It is bothersome to her and mildly painful.  It is right at her sock line so that is very irritating.  It has slowly grown over time.  No other systemic symptoms.  No fevers or chills.  No significant swelling.     Past Medical History:  Diagnosis Date  . Fibroids    confrimed from medical records  . GERD (gastroesophageal reflux disease)   . Heart murmur   . Sickle cell trait [facility])     Past Surgical History:  Procedure Laterality Date  . BREAST BIOPSY Right 01/29/2021   Affirm bx-"X" clip-path pending  . BREAST CYST EXCISION Right 50 years ago   neg  . BREAST SURGERY    . LAPAROSCOPIC ASSISTED VAGINAL HYSTERECTOMY  11/18/2009   partial hysterectomy only ovaries left no h/o h/o abnormal pap   . TUBAL LIGATION       Family History  Problem Relation Age of Onset  . Cancer Maternal Aunt        ?lung  . Hypertension Maternal Grandmother   . Cancer Maternal Grandfather        ?type  . Breast cancer Cousin   . Cancer Cousin        uterine  . Cancer Sister        jaw   . Heart attack Brother   . Hypertension Other        siblings   . Cancer Brother        GU cancer  . Cancer Maternal Aunt        ?lung      Social History   Tobacco Use  . Smoking status: Never Smoker  . Smokeless tobacco: Never Used  Substance Use  Topics  . Alcohol use: Not Currently  . Drug use: Never     Allergies  Allergen Reactions  . Sulfa Antibiotics Swelling    Facial Swelling  . Demerol [Meperidine Hcl] Rash    Current Outpatient Medications  Medication Sig Dispense Refill  . Calcium Carbonate-Vitamin D 600-400 MG-UNIT tablet Take 1 tablet by mouth daily.    . fluticasone (FLONASE) 50 MCG/ACT nasal spray Place 2 sprays into both nostrils daily. 18.2 mL 2  . Iron, Ferrous Sulfate, 325 (65 Fe) MG TABS     . levocetirizine (XYZAL) 5 MG tablet Take 1 tablet (5 mg total) by mouth every evening. 90 tablet 1  . linaclotide (LINZESS) 145 MCG CAPS capsule Take 145 mcg by mouth daily before breakfast.    . Multiple Vitamins-Minerals (CENTRUM SILVER PO) Take by mouth.    . Cholecalciferol (VITAMIN D3) 50 MCG (2000 UT) TABS Take by mouth. (Patient not taking: No sig reported)     No current facility-administered medications for this visit.      REVIEW  OF SYSTEMS (Negative unless checked)  Constitutional: [] Weight loss  [] Fever  [] Chills Cardiac: [] Chest pain   [] Chest pressure   [] Palpitations   [] Shortness of breath when laying flat   [] Shortness of breath at rest   [] Shortness of breath with exertion. Vascular:  [x] Pain in legs with walking   [x] Pain in legs at rest   [] Pain in legs when laying flat   [] Claudication   [] Pain in feet when walking  [] Pain in feet at rest  [] Pain in feet when laying flat   [] History of DVT   [] Phlebitis   [] Swelling in legs   [] Varicose veins   [] Non-healing ulcers Pulmonary:   [] Uses home oxygen   [] Productive cough   [] Hemoptysis   [] Wheeze  [] COPD   [] Asthma Neurologic:  [] Dizziness  [] Blackouts   [] Seizures   [] History of stroke   [] History of TIA  [] Aphasia   [] Temporary blindness   [] Dysphagia   [] Weakness or numbness in arms   [] Weakness or numbness in legs Musculoskeletal:  [] Arthritis   [] Joint swelling   [] Joint pain   [] Low back pain Hematologic:  [] Easy bruising  [] Easy bleeding    [] Hypercoagulable state   [] Anemic  [] Hepatitis Gastrointestinal:  [] Blood in stool   [] Vomiting blood  [x] Gastroesophageal reflux/heartburn   [] Abdominal pain Genitourinary:  [] Chronic kidney disease   [] Difficult urination  [] Frequent urination  [] Burning with urination   [] Hematuria Skin:  [] Rashes   [] Ulcers   [] Wounds Psychological:  [] History of anxiety   []  History of major depression.    Physical Exam BP 136/79 (BP Location: Right Arm)   Pulse 80   Resp 16   Ht 5' 6.5" (1.689 m)   Wt 153 lb (69.4 kg)   BMI 24.32 kg/m  Gen:  WD/WN, NAD. Appears younger than stated age. Head: [place]/AT, No temporalis wasting.  Ear/Nose/Throat: Hearing grossly intact, nares w/o erythema or drainage, oropharynx w/o Erythema/Exudate Eyes: Conjunctiva clear, sclera non-icteric  Neck: trachea midline.  No JVD.  Pulmonary:  Good air movement, respirations not labored, no use of accessory muscles  Cardiac: RRR, no JVD Vascular:  Vessel Right Left  Radial Palpable Palpable                                   Gastrointestinal:. No masses, surgical incisions, or scars. Musculoskeletal: M/S 5/5 throughout.  Extremities without ischemic changes.  No deformity or atrophy.  A small slightly firm mass is present just above the left lateral ankle anteriorly.  It is not as soft as a varicose vein and has more of a consistency of a sebaceous cyst.  No significant lower extremity edema. Neurologic: Sensation grossly intact in extremities.  Symmetrical.  Speech is fluent. Motor exam as listed above. Psychiatric: Judgment intact, Mood & affect appropriate for pt's clinical situation. Dermatologic: No rashes or ulcers noted.  No cellulitis or open wounds.    Radiology US BREAST LTD UNI LEFT INC AXILLA  Result Date: 01/21/2021 CLINICAL DATA:  Patient returns after screening study for evaluation of possible RIGHT breast calcifications and possible LEFT breast distortion and asymmetry. EXAM: DIGITAL DIAGNOSTIC  BILATERAL MAMMOGRAM WITH TOMOSYNTHESIS AND CAD; ULTRASOUND LEFT BREAST LIMITED TECHNIQUE: Bilateral digital diagnostic mammography and breast tomosynthesis was performed. The images were evaluated with computer-aided detection.; Targeted ultrasound examination of the left breast was performed COMPARISON:  Previous exam(s). ACR Breast Density Category c: The breast tissue is heterogeneously dense, which may obscure small  masses. FINDINGS: RIGHT BREAST: Mammogram: Magnified views are performed of calcifications in the UPPER INNER QUADRANT of the RIGHT breast. On magnified views there is a 3 millimeter group of calcifications which are primarily punctate, varying in size and density. Mammographic images were processed with CAD. LEFT BREAST: Mammogram: Additional 2-D and 3-D images are performed. These views show no persistent asymmetry in the superior portion of the LEFT breast. Dense fibroglandular tissue is identified in the LOWER INNER QUADRANT of the LEFT breast, not associated with distortion on spot compression views. Mammographic images were processed with CAD. Physical Exam: I palpate no abnormality in the LOWER INNER QUADRANT of the LEFT breast. Ultrasound: Targeted ultrasound is performed, showing normal appearing fibroglandular tissue in the LOWER INNER QUADRANT of the LEFT breast. No suspicious mass, distortion, or acoustic shadowing is demonstrated with ultrasound. IMPRESSION: 1. Indeterminate calcifications in the UPPER INNER QUADRANT of the RIGHT breast. 2. No persistent abnormality in the LEFT breast. RECOMMENDATION: Stereotactic guided core biopsy of RIGHT breast calcifications. I have discussed the findings and recommendations with the patient. If applicable, a reminder letter will be sent to the patient regarding the next appointment. BI-RADS CATEGORY  4: Suspicious. Electronically Signed   By: Nolon Nations M.D.   On: 01/21/2021 15:42   MM DIAG BREAST TOMO BILATERAL  Result Date:  01/21/2021 CLINICAL DATA:  Patient returns after screening study for evaluation of possible RIGHT breast calcifications and possible LEFT breast distortion and asymmetry. EXAM: DIGITAL DIAGNOSTIC BILATERAL MAMMOGRAM WITH TOMOSYNTHESIS AND CAD; ULTRASOUND LEFT BREAST LIMITED TECHNIQUE: Bilateral digital diagnostic mammography and breast tomosynthesis was performed. The images were evaluated with computer-aided detection.; Targeted ultrasound examination of the left breast was performed COMPARISON:  Previous exam(s). ACR Breast Density Category c: The breast tissue is heterogeneously dense, which may obscure small masses. FINDINGS: RIGHT BREAST: Mammogram: Magnified views are performed of calcifications in the UPPER INNER QUADRANT of the RIGHT breast. On magnified views there is a 3 millimeter group of calcifications which are primarily punctate, varying in size and density. Mammographic images were processed with CAD. LEFT BREAST: Mammogram: Additional 2-D and 3-D images are performed. These views show no persistent asymmetry in the superior portion of the LEFT breast. Dense fibroglandular tissue is identified in the LOWER INNER QUADRANT of the LEFT breast, not associated with distortion on spot compression views. Mammographic images were processed with CAD. Physical Exam: I palpate no abnormality in the LOWER INNER QUADRANT of the LEFT breast. Ultrasound: Targeted ultrasound is performed, showing normal appearing fibroglandular tissue in the LOWER INNER QUADRANT of the LEFT breast. No suspicious mass, distortion, or acoustic shadowing is demonstrated with ultrasound. IMPRESSION: 1. Indeterminate calcifications in the UPPER INNER QUADRANT of the RIGHT breast. 2. No persistent abnormality in the LEFT breast. RECOMMENDATION: Stereotactic guided core biopsy of RIGHT breast calcifications. I have discussed the findings and recommendations with the patient. If applicable, a reminder letter will be sent to the patient  regarding the next appointment. BI-RADS CATEGORY  4: Suspicious. Electronically Signed   By: Nolon Nations M.D.   On: 01/21/2021 15:42   [place] LT LOWER EXTREM LTD SOFT TISSUE NON VASCULAR  Result Date: 01/21/2021 CLINICAL DATA:  Anterior left ankle swelling for 2 years EXAM: ULTRASOUND LEFT LOWER EXTREMITY LIMITED TECHNIQUE: Ultrasound examination of the lower extremity soft tissues was performed in the area of clinical concern. COMPARISON:  None. FINDINGS: Targeted ultrasound examination of the lateral left ankle at the patient identified site of palpable abnormality reveals a very superficial hypoechoic  and septated lesion measuring 3.0 x 1.7 x 0.7 cm, deformable with compression and demonstrating some evidence of internal Doppler flow. IMPRESSION: Nonspecific 3.0 x 1.7 x 0.7 cm hypoechoic and septated lesion corresponding to the patient identified site of palpable abnormality, deformable with compression and demonstrating some evidence of internal Doppler flow. 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 Electronically Signed   By: Eddie Candle M.D.   On: 01/21/2021 14:07   MM CLIP PLACEMENT RIGHT  Result Date: 01/29/2021 CLINICAL DATA:  Status post stereotactic guided core needle biopsy right breast calcifications. EXAM: DIAGNOSTIC RIGHT MAMMOGRAM POST STEREOTACTIC BIOPSY COMPARISON:  Previous exam(s). FINDINGS: Mammographic images were obtained following stereotactic guided biopsy of right breast calcifications. The biopsy marking clip is is located approximately 1.1 cm inferior to the site of biopsied calcifications IMPRESSION: Approximate 1.1 cm inferior migration of the X shaped clip relative to the site of biopsied calcifications. There are residual calcifications demonstrated. Final Assessment: Post Procedure Mammograms for Marker Placement Electronically Signed   By: Lovey Newcomer M.D.   On: 01/29/2021 09:23   MM RT BREAST BX W LOC DEV 1ST LESION IMAGE BX SPEC STEREO  GUIDE  Addendum Date: 02/08/2021   ADDENDUM REPORT: 02/05/2021 12:31 ADDENDUM: PATHOLOGY revealed: A. BREAST, RIGHT, UPPER INNER QUADRANT; STEREOTACTIC-GUIDED CORE BIOPSY: - BENIGN FIBROADIPOSE TISSUE CONTAINING ONE CALCIFICATION. Comment: Additional deeper sections of blocks A1 (sector A) and A2 (sector H) were reviewed. In A2 one 0.3 mm calcification is recovered, not associated with an identifiable epithelial structure. Correlation with all imaging findings is recommended to be sure the sample contains the area of concern. Pathology results are inconclusive per Dr. Lovey Newcomer, and re-biopsy is recommended using stereotactic biopsy with tomographic technique (3D) procedure to obtain a definitive diagnosis of biopsied area. Pathology results and recommendations below were discussed with patient by telephone on 02/03/2021. Patient reported biopsy site within normal limits with slight tenderness at the site, and no significant bruising. Post biopsy care instructions were reviewed, questions were answered and my direct phone number was provided to patient. Patient was instructed to call [facility] if any concerns or questions arise related to the biopsy. Recommendation: Please schedule patient for RIGHT breast stereotactic biopsy with tomographic technique (3D) procedure of same group of calcifications biopsied on 01/29/2021. Allow 2 - 3 weeks before re-biopsy. Pathology results reported by Electa Sniff RN on 02/05/2021. Electronically Signed   By: Lovey Newcomer M.D.   On: 02/05/2021 12:31   Result Date: 02/08/2021 CLINICAL DATA:  Patient with indeterminate right breast calcifications. EXAM: RIGHT BREAST STEREOTACTIC CORE NEEDLE BIOPSY COMPARISON:  Previous exams. FINDINGS: The patient and I discussed the procedure of stereotactic-guided biopsy including benefits and alternatives. We discussed the high likelihood of a successful procedure. We discussed the risks of the procedure including infection, bleeding,  tissue injury, clip migration, and inadequate sampling. Informed written consent was given. The usual time out protocol was performed immediately prior to the procedure. Using sterile technique and 1% Lidocaine as local anesthetic, under stereotactic guidance, a 9 gauge vacuum assisted device was used to perform core needle biopsy of calcifications within the upper inner right breast using a cranial approach. Specimen radiograph was performed showing calcifications. Specimens with calcifications are identified for pathology. Lesion quadrant: Upper inner quadrant At the conclusion of the procedure, X shaped tissue marker clip was deployed into the biopsy cavity. Follow-up 2-view mammogram was performed and dictated separately. IMPRESSION: Stereotactic-guided biopsy of right breast calcifications. No apparent complications. Electronically Signed: By: Dian Situ  Rosana Hoes M.D. On: 01/29/2021 09:19    Labs Recent Results (from the past 2160 hour(s))  CBC with Differential/Platelet     Status: Abnormal   Collection Time: 12/23/20  1:50 PM  Result Value Ref Range   WBC 4.0 3.4 - 10.8 x10E3/uL   RBC 5.30 (H) 3.77 - 5.28 x10E6/uL   Hemoglobin 12.8 11.1 - 15.9 g/dL   Hematocrit 39.9 34.0 - 46.6 %   MCV 75 (L) 79 - 97 fL   MCH 24.2 (L) 26.6 - 33.0 pg   MCHC 32.1 31.5 - 35.7 g/dL   RDW 14.7 11.7 - 15.4 %   Platelets 270 150 - 450 x10E3/uL   Neutrophils 38 Not Estab. %   Lymphs 46 Not Estab. %   Monocytes 11 Not Estab. %   Eos 4 Not Estab. %   Basos 1 Not Estab. %   Neutrophils Absolute 1.5 1.4 - 7.0 x10E3/uL   Lymphocytes Absolute 1.8 0.7 - 3.1 x10E3/uL   Monocytes Absolute 0.5 0.1 - 0.9 x10E3/uL   EOS (ABSOLUTE) 0.1 0.0 - 0.4 x10E3/uL   Basophils Absolute 0.0 0.0 - 0.2 x10E3/uL   Immature Granulocytes 0 Not Estab. %   Immature Grans (Abs) 0.0 0.0 - 0.1 x10E3/uL  Comprehensive Metabolic Panel (CMET)     Status: None   Collection Time: 12/23/20  1:50 PM  Result Value Ref Range   Glucose 87 65 - 99 mg/dL    BUN 12 8 - 27 mg/dL   Creatinine, Ser 0.75 0.57 - 1.00 mg/dL   eGFR 89 >59 mL/min/1.73   BUN/Creatinine Ratio 16 12 - 28   Sodium 142 134 - 144 mmol/L   Potassium 5.2 3.5 - 5.2 mmol/L   Chloride 103 96 - 106 mmol/L   CO2 23 20 - 29 mmol/L   Calcium 9.6 8.7 - 10.3 mg/dL   Total Protein 7.8 6.0 - 8.5 g/dL   Albumin 4.5 3.8 - 4.8 g/dL   Globulin, Total 3.3 1.5 - 4.5 g/dL   Albumin/Globulin Ratio 1.4 1.2 - 2.2   Bilirubin Total 0.3 0.0 - 1.2 mg/dL   Alkaline Phosphatase 74 44 - 121 IU/L   AST 21 0 - 40 IU/L   ALT 12 0 - 32 IU/L  TSH     Status: None   Collection Time: 12/23/20  1:50 PM  Result Value Ref Range   TSH 3.060 0.450 - 4.500 uIU/mL  Lipid Panel w/o Chol/HDL Ratio     Status: Abnormal   Collection Time: 12/23/20  1:50 PM  Result Value Ref Range   Cholesterol, Total 174 100 - 199 mg/dL   Triglycerides 67 0 - 149 mg/dL   HDL 56 >39 mg/dL   VLDL Cholesterol Cal 13 5 - 40 mg/dL   LDL Chol Calc (NIH) 105 (H) 0 - 99 mg/dL  VITAMIN D 25 Hydroxy (Vit-D Deficiency, Fractures)     Status: None   Collection Time: 12/23/20  1:50 PM  Result Value Ref Range   Vit D, 25-Hydroxy 42.3 30.0 - 100.0 ng/mL    Comment: Vitamin D deficiency has been defined by the Institute of Medicine and an Endocrine Society practice guideline as a level of serum 25-OH vitamin D less than 20 ng/mL (1,2). The Endocrine Society went on to further define vitamin D insufficiency as a level between 21 and 29 ng/mL (2). 1. IOM (Institute of Medicine). 2010. Dietary reference    intakes for calcium and D. [place]: The    [employer]. 2.  Holick MF, Binkley [place], Bischoff-Ferrari HA, et al.    Evaluation, treatment, and prevention of vitamin D    deficiency: an Endocrine Society clinical practice    guideline. JCEM. 2011 Jul; 96(7):1911-30.   ANA Direct w/Reflex if Positive     Status: None   Collection Time: 12/23/20  1:50 PM  Result Value Ref Range   Anti Nuclear Antibody (ANA) Negative  Negative  Rheumatoid factor     Status: None   Collection Time: 12/23/20  1:50 PM  Result Value Ref Range   Rhuematoid fact SerPl-aCnc <10.0 <14.0 IU/mL  Iron, TIBC and Ferritin Panel     Status: Abnormal   Collection Time: 12/23/20  1:50 PM  Result Value Ref Range   Total Iron Binding Capacity 299 250 - 450 ug/dL   UIBC 180 118 - 369 ug/dL   Iron 119 27 - 139 ug/dL   Iron Saturation 40 15 - 55 %   Ferritin 229 (H) 15 - 150 ng/mL  Specimen status report     Status: None   Collection Time: 12/23/20  1:50 PM  Result Value Ref Range   specimen status report Comment     Comment: Written Authorization Written Authorization Written Authorization Received. Authorization received from [facility] 12-25-2020 Logged by Marcene Duos   POCT Urinalysis Dip Manual     Status: Normal   Collection Time: 12/23/20  2:43 PM  Result Value Ref Range   Spec Grav, UA 1.010 1.010 - 1.025   pH, UA 5.0 5.0 - 8.0   Leukocytes, UA Negative Negative   Nitrite, UA Negative Negative   Poct Protein Negative Negative, trace mg/dL   Poct Glucose Normal Normal mg/dL   Poct Ketones Negative Negative   Poct Urobilinogen Normal Normal mg/dL   Poct Bilirubin Negative Negative   Poct Blood Negative Negative, trace  Surgical pathology     Status: None   Collection Time: 01/29/21  9:21 AM  Result Value Ref Range   SURGICAL PATHOLOGY      SURGICAL PATHOLOGY CASE: [id number] PATIENT: Vernell Barrier Surgical Pathology Report     Specimen Submitted: A. Breast, right, upper inner quadrant; biopsy  Clinical History: Right breast upper inner quadrant with 3 mm group of indeterminate calcifications. Impression: fibrocystic disease vs fibroadenoma-type vs DCIS. Post biopsy mammograms show the biopsy marking clip located approximately 1.1 cm inferior to the site of biopsied calcifications     DIAGNOSIS: A. BREAST, RIGHT, UPPER INNER QUADRANT; STEREOTACTIC-GUIDED CORE BIOPSY: - BENIGN  FIBROADIPOSE TISSUE CONTAINING ONE CALCIFICATION.  Comment: Additional deeper sections of blocks A1 (sector A) and A2 (sector H) were reviewed. In A2 one 0.3 mm calcification is recovered, not associated with an identifiable epithelial structure.  Correlation with all imaging findings is recommended to be sure the sample contains the area of concern.  GROSS DESCRIPTION: A. Labeled: Right breast stereo biopsy ca lcs upper inner quadrant Received: in a formalin-filled [place] collection device Specimen radiograph image(s) available for review Time/Date in fixative: Collected at 8:49 AM on 01/29/2021 and placed in formalin at 8:50 AM on 01/29/2021 Cold ischemic time: Less than 5 minutes Total fixation time: 10.5 hours Core pieces: Multiple Measurement: Aggregate, 6.5 x 1.4 x 0.5 cm Description / comments: Received are cores and fragments of yellow fibrofatty tissue.  The accompanying diagram as sections A and H checked. Inked: Blue Entirely submitted in cassette(s):  1 - section A 2 - section H 3 - 6 - remaining tissue fragments  3 - sections B and C      4 - sections D and E      5 - sections F and G      6 - sections I, J, and K  RB 01/29/2021  Final Diagnosis performed by Bryan Lemma, MD.   Electronically signed 02/02/2021 12:27:46PM The electronic signature indicates that the named Attending Pathologist has evaluated the specimen Technical component performed at [facility], [address], [place], [place] [address] Lab: [phone] Dir: Rush Farmer, MD, MMM  Professional component performed at [facility], [facility], [place], [place], [place] [address] Lab: [phone] Dir: Dellia Nims. Rubinas, MD     Assessment/Plan:  Soft tissue disorder of ankle I have discussed with the patient a few options for evaluation and treatment of this lesion.  If it is significantly bothering her enough, we could just perform an excisional biopsy of this area.   Another option would be to perform an ultrasound and evaluate the venous system of that leg as well to see if there is any vascular component to it.  Even if there is, it could be ligated and resected if it is bothering her.  She would prefer to start with an ultrasound.  I think that is a reasonable option.  We will see her back following the study.  GERD (gastroesophageal reflux disease) Continue PPI as already ordered, this medication has been reviewed and there are no changes at this time. Avoidence of caffeine and alcohol Moderate elevation of the head of the bed       Leotis Pain 02/17/2021, 1:10 PM   This note was created with Dragon medical transcription system.  Any errors from dictation are unintentional.

## 2021-02-17 DIAGNOSIS — K219 Gastro-esophageal reflux disease without esophagitis: Secondary | ICD-10-CM | POA: Insufficient documentation

## 2021-02-17 NOTE — Assessment & Plan Note (Signed)
I have discussed with the patient a few options for evaluation and treatment of this lesion.  If it is significantly bothering her enough, we could just perform an excisional biopsy of this area.  Another option would be to perform an ultrasound and evaluate the venous system of that leg as well to see if there is any vascular component to it.  Even if there is, it could be ligated and resected if it is bothering her.  She would prefer to start with an ultrasound.  I think that is a reasonable option.  We will see her back following the study.

## 2021-02-17 NOTE — Assessment & Plan Note (Signed)
Continue PPI as already ordered, this medication has been reviewed and there are no changes at this time. Avoidence of caffeine and alcohol Moderate elevation of the head of the bed  

## 2021-02-18 DIAGNOSIS — Z23 Encounter for immunization: Secondary | ICD-10-CM | POA: Diagnosis not present

## 2021-03-01 ENCOUNTER — Encounter: Payer: Self-pay | Admitting: Internal Medicine

## 2021-03-01 DIAGNOSIS — Z0000 Encounter for general adult medical examination without abnormal findings: Secondary | ICD-10-CM | POA: Insufficient documentation

## 2021-03-01 DIAGNOSIS — L821 Other seborrheic keratosis: Secondary | ICD-10-CM | POA: Insufficient documentation

## 2021-03-01 DIAGNOSIS — R011 Cardiac murmur, unspecified: Secondary | ICD-10-CM | POA: Insufficient documentation

## 2021-03-01 MED ORDER — TETANUS-DIPHTH-ACELL PERTUSSIS 5-2.5-18.5 LF-MCG/0.5 IM SUSP: 0.5000 mL | Freq: Once | INTRAMUSCULAR | 0 refills | Status: DC

## 2021-03-02 ENCOUNTER — Ambulatory Visit
Admission: RE | Admit: 2021-03-02 | Discharge: 2021-03-02 | Disposition: A | Discharge status: Home / Self Care | Payer: Federal, State, Local not specified - PPO | Source: Ambulatory Visit | Attending: Adult Health | Admitting: Adult Health

## 2021-03-02 ENCOUNTER — Other Ambulatory Visit: Payer: Self-pay

## 2021-03-02 DIAGNOSIS — R921 Mammographic calcification found on diagnostic imaging of breast: Secondary | ICD-10-CM | POA: Diagnosis not present

## 2021-03-02 DIAGNOSIS — R928 Other abnormal and inconclusive findings on diagnostic imaging of breast: Secondary | ICD-10-CM

## 2021-03-02 DIAGNOSIS — R920 Mammographic microcalcification found on diagnostic imaging of breast: Secondary | ICD-10-CM | POA: Diagnosis not present

## 2021-03-02 HISTORY — PX: BREAST BIOPSY: SHX20

## 2021-03-03 LAB — SURGICAL PATHOLOGY

## 2021-03-05 ENCOUNTER — Encounter (INDEPENDENT_AMBULATORY_CARE_PROVIDER_SITE_OTHER): Payer: Self-pay | Admitting: Nurse Practitioner

## 2021-03-05 ENCOUNTER — Ambulatory Visit (INDEPENDENT_AMBULATORY_CARE_PROVIDER_SITE_OTHER): Payer: Federal, State, Local not specified - PPO

## 2021-03-05 ENCOUNTER — Other Ambulatory Visit: Payer: Self-pay

## 2021-03-05 ENCOUNTER — Ambulatory Visit (INDEPENDENT_AMBULATORY_CARE_PROVIDER_SITE_OTHER): Payer: Federal, State, Local not specified - PPO | Admitting: Nurse Practitioner

## 2021-03-05 VITALS — BP 128/76 | HR 83 | Ht 66.0 in | Wt 152.0 lb

## 2021-03-05 DIAGNOSIS — K219 Gastro-esophageal reflux disease without esophagitis: Secondary | ICD-10-CM | POA: Diagnosis not present

## 2021-03-05 DIAGNOSIS — M799 Soft tissue disorder, unspecified: Secondary | ICD-10-CM | POA: Diagnosis not present

## 2021-03-07 ENCOUNTER — Encounter (INDEPENDENT_AMBULATORY_CARE_PROVIDER_SITE_OTHER): Payer: Self-pay | Admitting: Nurse Practitioner

## 2021-03-07 NOTE — Progress Notes (Signed)
Subjective:    Patient ID: Kim Mitchell, female    DOB: 12-20-1955, 65 y.o.   MRN: [id number] Chief Complaint  Patient presents with  . Follow-up    Pt conv U/S     Kim Mitchell is a 65 y.o. female.  The patient returns today for evaluation of a small round mass that is situated above the left lateral ankle.  The patient notes that its been there for significant amount of time.  She estimates its been well over a decade.  The area will get smaller and disappear for some time and then it becomes prominent.  The patient notes that it is not extremely painful.  The most bothersome thing for the patient is not knowing exactly what it is.  When she wears socks it is irritating to her.  It has not ever ulcerated or developed any bruising.  No fevers or chills no significant swelling other than that area.  Today noninvasive show a prominent 2.42 x 1.77 cm heterogeneous mass.  Likely a sebaceous cyst.  No vascular component.  It is anterior to the peroneal artery.  No varicosities are seen near the mass.    Review of Systems  Skin: Negative for wound.  All other systems reviewed and are negative.      Objective:   Physical Exam Vitals reviewed.  HENT:     Head: Normocephalic.  Cardiovascular:     Rate and Rhythm: Normal rate.     Pulses: Normal pulses.  Pulmonary:     Effort: Pulmonary effort is normal.  Neurological:     Mental Status: She is alert and oriented to person, place, and time.  Psychiatric:        Mood and Affect: Mood normal.        Behavior: Behavior normal.        Thought Content: Thought content normal.        Judgment: Judgment normal.     BP 128/76   Pulse 83   Ht 5\' 6"  (1.676 m)   Wt 152 lb (68.9 kg)   BMI 24.53 kg/m   Past Medical History:  Diagnosis Date  . Fibroids    confrimed from medical records  . GERD (gastroesophageal reflux disease)   . Heart murmur   . Sickle cell trait ([place])     Social History   Socioeconomic History  .  Marital status: Married    Spouse name: Not on file  . Number of children: Not on file  . Years of education: Not on file  . Highest education level: Not on file  Occupational History  . Not on file  Tobacco Use  . Smoking status: Never Smoker  . Smokeless tobacco: Never Used  Substance and Sexual Activity  . Alcohol use: Not Currently  . Drug use: Never  . Sexual activity: Not Currently  Other Topics Concern  . Not on file  Social History Narrative   7 siblings, 4 living as of 02/12/21    Retired Producer, television/film/video    2 female kids 45, 43 02/12/21    Grew up [place] moved to [employer] 2nd grade relocated here [place] Strain: Not on file  Food Insecurity: Not on file  Transportation Needs: Not on file  Physical Activity: Not on file  Stress: Not on file  Social Connections: Not on file  Intimate Partner Violence: Not on file    Past  Surgical History:  Procedure Laterality Date  . BREAST BIOPSY Right 01/29/2021   Affirm bx-"X" clip-path pending  . BREAST BIOPSY Right 03/02/2021   Sffirm bx- coil clip, path pending   . BREAST CYST EXCISION Right 50 years ago ~1970   neg  . BREAST SURGERY    . LAPAROSCOPIC ASSISTED VAGINAL HYSTERECTOMY  11/18/2009   partial hysterectomy only ovaries left no h/o h/o abnormal pap   . TUBAL LIGATION     1985    Family History  Problem Relation Age of Onset  . Cancer Maternal Aunt        ?lung  . Hypertension Maternal Grandmother   . Cancer Maternal Grandfather        ?type  . Breast cancer Cousin   . Cancer Cousin        uterine  . Cancer Sister        jaw   . Heart attack Brother   . Hypertension Other        siblings   . Cancer Brother        GU cancer  . Cancer Maternal Aunt        ?lung    Allergies  Allergen Reactions  . Sulfa Antibiotics Swelling    Facial Swelling  . Demerol [Meperidine Hcl] Rash    CBC Latest Ref Rng & Units 12/23/2020 12/16/2019  WBC  3.4 - 10.8 x10E3/uL 4.0 3.4  Hemoglobin 11.1 - 15.9 g/dL 12.8 12.1  Hematocrit 34.0 - 46.6 % 39.9 38.5  Platelets 150 - 450 x10E3/uL 270 222      CMP     Component Value Date/Time   NA 142 12/23/2020 1350   K 5.2 12/23/2020 1350   CL 103 12/23/2020 1350   CO2 23 12/23/2020 1350   GLUCOSE 87 12/23/2020 1350   BUN 12 12/23/2020 1350   CREATININE 0.75 12/23/2020 1350   CALCIUM 9.6 12/23/2020 1350   PROT 7.8 12/23/2020 1350   ALBUMIN 4.5 12/23/2020 1350   AST 21 12/23/2020 1350   ALT 12 12/23/2020 1350   ALKPHOS 74 12/23/2020 1350   BILITOT 0.3 12/23/2020 1350   GFRNONAA 71 12/16/2019 0000   GFRAA 82 12/16/2019 0000     No results found.     Assessment & Plan:   1. Soft tissue disorder of ankle I had a discussion with patient in regards to her options with this swelling area.  Because is not exquisitely painful and because the size tends to be stable year after year, watchful waiting could be done to ensure that it does not have any significant growth.  We could also excise the growth if it is significantly painful for her.  The patient prefers the more conservative option.  If the patient continues to have worsening swelling or discomfort she is advised to follow-up with [place] otherwise we will have her follow-up on an as-needed basis.  2. Gastroesophageal reflux disease without esophagitis Continue PPI as already ordered, this medication has been reviewed and there are no changes at this time.  Avoidence of caffeine and alcohol  Moderate elevation of the head of the bed    Current Outpatient Medications on File Prior to Visit  Medication Sig Dispense Refill  . Calcium Carbonate-Vitamin D 600-400 MG-UNIT tablet Take 1 tablet by mouth daily.    . fluticasone (FLONASE) 50 MCG/ACT nasal spray Place 2 sprays into both nostrils daily. 18.2 mL 2  . Iron, Ferrous Sulfate, 325 (65 Fe) MG TABS     .  levocetirizine (XYZAL) 5 MG tablet Take 1 tablet (5 mg total) by mouth every  evening. 90 tablet 1  . linaclotide (LINZESS) 145 MCG CAPS capsule Take 145 mcg by mouth daily before breakfast.    . Multiple Vitamins-Minerals (CENTRUM SILVER PO) Take by mouth.     No current facility-administered medications on file prior to visit.    There are no Patient Instructions on file for this visit. No follow-ups on file.   Kris Hartmann, NP

## 2021-03-18 ENCOUNTER — Encounter: Payer: Self-pay | Admitting: Internal Medicine

## 2021-04-06 ENCOUNTER — Telehealth: Payer: Self-pay | Admitting: Internal Medicine

## 2021-04-06 NOTE — Telephone Encounter (Signed)
Rejection Reason - Patient did not respond" Kim Mitchell said on Apr 05, 2021 2:52 PM  Msg sent from [facility]

## 2021-04-15 ENCOUNTER — Encounter: Payer: Self-pay | Admitting: Internal Medicine

## 2021-04-15 ENCOUNTER — Telehealth: Payer: Federal, State, Local not specified - PPO | Admitting: Physician Assistant

## 2021-04-15 DIAGNOSIS — U071 COVID-19: Secondary | ICD-10-CM | POA: Diagnosis not present

## 2021-04-15 MED ORDER — MOLNUPIRAVIR EUA 200MG CAPSULE: 4.0000 | ORAL_CAPSULE | Freq: Two times a day (BID) | ORAL | 0 refills | Status: AC

## 2021-04-15 MED ORDER — BENZONATATE 100 MG PO CAPS: 100.0000 mg | ORAL_CAPSULE | Freq: Three times a day (TID) | ORAL | 0 refills | Status: DC | PRN

## 2021-04-15 NOTE — Progress Notes (Signed)
Virtual Visit Consent   Kim Mitchell, you are scheduled for a virtual visit with a [facility] provider today.     Just as with appointments in the office, your consent must be obtained to participate.  Your consent will be active for this visit and any virtual visit you may have with one of our providers in the next 365 days.     If you have a MyChart account, a copy of this consent can be sent to you electronically.  All virtual visits are billed to your insurance company just like a traditional visit in the office.    As this is a virtual visit, video technology does not allow for your provider to perform a traditional examination.  This may limit your provider's ability to fully assess your condition.  If your provider identifies any concerns that need to be evaluated in person or the need to arrange testing (such as labs, EKG, etc.), we will make arrangements to do so.     Although advances in technology are sophisticated, we cannot ensure that it will always work on either your end or our end.  If the connection with a video visit is poor, the visit may have to be switched to a telephone visit.  With either a video or telephone visit, we are not always able to ensure that we have a secure connection.     I need to obtain your verbal consent now.   Are you willing to proceed with your visit today?    Kim Mitchell has provided verbal consent on 04/15/2021 for a virtual visit (video or telephone).   Leeanne Rio, [place]   Date: 04/15/2021 10:47 AM   Virtual Visit via Video Note   I, Leeanne Rio, connected with  Kim Mitchell  ([id number], 10/16/1955) on 04/15/21 at 10:45 AM EDT by a video-enabled telemedicine application and verified that I am speaking with the correct person using two identifiers.  Location: Patient: Virtual Visit Location Patient: Home Provider: Virtual Visit Location Provider: Home Office   I discussed the limitations of evaluation and  management by telemedicine and the availability of in person appointments. The patient expressed understanding and agreed to proceed.    History of Present Illness: Kim Mitchell is a 65 y.o. who identifies as a female who was assigned female at birth, and is being seen today for + COVID-19. Patient endorses aches, dry cough, nasal congestion, headache and sore throat. Initially testing for COVID on Monday which was negative. Since symptoms were worsening she retested Tuesday which came back positive. Notes some sinus pressure without sinus pain or tooth pain. Denies chest pain, SOB. Denies loss of taste or smell. Is noting fatigue.   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   Seborrheic keratosis 03/01/2021   Annual physical exam 03/01/2021   Heart murmur    GERD (gastroesophageal reflux disease) 02/17/2021   Soft tissue disorder of ankle 12/28/2020   Arthralgia of right hand- and left hand 12/28/2020   Acute left ankle pain 12/28/2020   Seasonal allergies 12/23/2020   History of partial hysterectomy 12/16/2019   Vaginal dryness, menopausal 12/16/2019   Screening for blood or protein in urine 12/16/2019   Swelling of joint of hand 12/16/2019   Vitamin D deficiency 12/16/2019   Leukocytes in urine 12/16/2019    Allergies:  Allergies  Allergen Reactions   Sulfa Antibiotics Swelling  Facial Swelling   Demerol [Meperidine Hcl] Rash   Medications:  Current Outpatient Medications:    benzonatate (TESSALON) 100 MG capsule, Take 1 capsule (100 mg total) by mouth 3 (three) times daily as needed for cough., Disp: 30 capsule, Rfl: 0   molnupiravir EUA 200 mg CAPS, Take 4 capsules (800 mg total) by mouth 2 (two) times daily for 5 days., Disp: 40 capsule, Rfl: 0   Calcium Carbonate-Vitamin D 600-400 MG-UNIT tablet, Take 1 tablet by mouth daily., Disp: , Rfl:    fluticasone (FLONASE) 50 MCG/ACT nasal spray, Place 2  sprays into both nostrils daily., Disp: 18.2 mL, Rfl: 2   Iron, Ferrous Sulfate, 325 (65 Fe) MG TABS, , Disp: , Rfl:    levocetirizine (XYZAL) 5 MG tablet, Take 1 tablet (5 mg total) by mouth every evening., Disp: 90 tablet, Rfl: 1   linaclotide (LINZESS) 145 MCG CAPS capsule, Take 145 mcg by mouth daily before breakfast., Disp: , Rfl:    Multiple Vitamins-Minerals (CENTRUM SILVER PO), Take by mouth., Disp: , Rfl:   Observations/Objective: Patient is well-developed, well-nourished in no acute distress.  Resting comfortably at home.  Head is normocephalic, atraumatic.  No labored breathing. Speech is clear and coherent with logical content.  Patient is alert and oriented at baseline.   Assessment and Plan: 1. COVID-19 - benzonatate (TESSALON) 100 MG capsule; Take 1 capsule (100 mg total) by mouth 3 (three) times daily as needed for cough.  Dispense: 30 capsule; Refill: 0 - molnupiravir EUA 200 mg CAPS; Take 4 capsules (800 mg total) by mouth 2 (two) times daily for 5 days.  Dispense: 40 capsule; Refill: 0 - MyChart COVID-19 home monitoring program; Future Mild-moderate symptoms in person with increased risk of complications. Discussed antiviral treatment and she would like to proceed with this. Rx molnupiravir to pharmacy to take as directed. Supportive measures, OTC medications and Vitamin regimen reviewed with patient. Rx Tessalon for cough. Patient has been enrolled in a COVID monitoring program through [facility]. Quarantine reviewed in detail. Strict ER precautions reviewed with patient who voiced understanding and agreement with plan.   Follow Up Instructions: I discussed the assessment and treatment plan with the patient. The patient was provided an opportunity to ask questions and all were answered. The patient agreed with the plan and demonstrated an understanding of the instructions.  A copy of instructions were sent to the patient via MyChart.  The patient was advised to call back or  seek an in-person evaluation if the symptoms worsen or if the condition fails to improve as anticipated.  Time:  I spent 15 minutes with the patient via telehealth technology discussing the above problems/concerns.    Leeanne Rio, PA-C

## 2021-04-15 NOTE — Patient Instructions (Signed)
  Vernell Barrier, thank you for joining Leeanne Rio, PA-C for today's virtual visit.  While this provider is not your primary care provider (PCP), if your PCP is located in our provider database this encounter information will be shared with them immediately following your visit.  Consent: (Patient) Kim Mitchell provided verbal consent for this virtual visit at the beginning of the encounter.  Current Medications:  Current Outpatient Medications:    Calcium Carbonate-Vitamin D 600-400 MG-UNIT tablet, Take 1 tablet by mouth daily., Disp: , Rfl:    fluticasone (FLONASE) 50 MCG/ACT nasal spray, Place 2 sprays into both nostrils daily., Disp: 18.2 mL, Rfl: 2   Iron, Ferrous Sulfate, 325 (65 Fe) MG TABS, , Disp: , Rfl:    levocetirizine (XYZAL) 5 MG tablet, Take 1 tablet (5 mg total) by mouth every evening., Disp: 90 tablet, Rfl: 1   linaclotide (LINZESS) 145 MCG CAPS capsule, Take 145 mcg by mouth daily before breakfast., Disp: , Rfl:    Multiple Vitamins-Minerals (CENTRUM SILVER PO), Take by mouth., Disp: , Rfl:    Medications ordered in this encounter:  No orders of the defined types were placed in this encounter.    *If you need refills on other medications prior to your next appointment, please contact your pharmacy*  Follow-Up: Call back or seek an in-person evaluation if the symptoms worsen or if the condition fails to improve as anticipated.  Other Instructions Please keep well-hydrated and get plenty of rest. Start a saline nasal rinse to flush out your nasal passages. You can use plain Mucinex to help thin congestion. If you have a humidifier, running in the bedroom at night. I want you to start OTC vitamin D3 1000 units daily, vitamin C 1000 mg daily, and a zinc supplement. Please take prescribed medications as directed.  You have been enrolled in a MyChart symptom monitoring program. Please answer these questions daily so we can keep track of how you are  doing.  You were to quarantine for 5 days from onset of your symptoms.  After day 5, if you have had no fever and you are feeling better, you can end quarantine but need to mask for an additional 5 days. After day 5 if you have a fever or are having significant symptoms, please quarantine for full 10 days.  If you note any worsening of symptoms, any significant shortness of breath or any chest pain, please seek ER evaluation ASAP.  Please do not delay care!    If you have been instructed to have an in-person evaluation today at a local Urgent Care facility, please use the link below. It will take you to a list of all of our available [facility], including address, phone number and hours of operation. Please do not delay care.  [facility]  If you or a family member do not have a primary care provider, use the link below to schedule a visit and establish care. When you choose a [facility] primary care physician or advanced practice provider, you gain a long-term partner in health. Find a Primary Care Provider  Learn more about 's in-office and virtual care options: [facility]

## 2021-04-16 ENCOUNTER — Telehealth: Payer: Self-pay

## 2021-04-16 ENCOUNTER — Encounter (INDEPENDENT_AMBULATORY_CARE_PROVIDER_SITE_OTHER): Payer: Self-pay

## 2021-04-16 NOTE — Telephone Encounter (Signed)
Patient states that her throat is sore and that it burns when she tries to eat. Patient has been taking over the counter medication for the sore throat. Patient advise per protocol for appetite as follows:   If appetite becomes worse: encourage patient to drink fluids as tolerated, work their way up to bland solid food such as crackers, pretzels, soup, bread or applesauce and boiled starches.   If patient is unable to tolerate any foods or liquids, notify PCP.   IF PATIENT DEVELOPS SEVERE VOMITING (MORE THAN 6 TIMES A DAY AND OR >8 HOURS) AND/OR SEVERE ABDOMINAL PAIN ADVISE PATIENT TO CALL 911 AND SEEK TREATMENT IN ED  Patient will continued to hydrate and monitor symptoms.

## 2021-04-21 DIAGNOSIS — Z20822 Contact with and (suspected) exposure to covid-19: Secondary | ICD-10-CM | POA: Diagnosis not present

## 2021-08-18 ENCOUNTER — Encounter: Payer: Self-pay | Admitting: Internal Medicine

## 2021-08-18 ENCOUNTER — Other Ambulatory Visit: Payer: Self-pay

## 2021-08-18 ENCOUNTER — Ambulatory Visit: Payer: Federal, State, Local not specified - PPO | Admitting: Internal Medicine

## 2021-08-18 VITALS — BP 118/80 | HR 78 | Temp 96.9°F | Ht 65.98 in | Wt 154.8 lb

## 2021-08-18 DIAGNOSIS — L821 Other seborrheic keratosis: Secondary | ICD-10-CM | POA: Diagnosis not present

## 2021-08-18 DIAGNOSIS — R718 Other abnormality of red blood cells: Secondary | ICD-10-CM

## 2021-08-18 DIAGNOSIS — Z1329 Encounter for screening for other suspected endocrine disorder: Secondary | ICD-10-CM

## 2021-08-18 DIAGNOSIS — Z1231 Encounter for screening mammogram for malignant neoplasm of breast: Secondary | ICD-10-CM | POA: Diagnosis not present

## 2021-08-18 DIAGNOSIS — E2839 Other primary ovarian failure: Secondary | ICD-10-CM

## 2021-08-18 DIAGNOSIS — K5900 Constipation, unspecified: Secondary | ICD-10-CM | POA: Diagnosis not present

## 2021-08-18 DIAGNOSIS — Z1389 Encounter for screening for other disorder: Secondary | ICD-10-CM

## 2021-08-18 DIAGNOSIS — K581 Irritable bowel syndrome with constipation: Secondary | ICD-10-CM | POA: Diagnosis not present

## 2021-08-18 DIAGNOSIS — Z13818 Encounter for screening for other digestive system disorders: Secondary | ICD-10-CM

## 2021-08-18 DIAGNOSIS — N281 Cyst of kidney, acquired: Secondary | ICD-10-CM

## 2021-08-18 DIAGNOSIS — E559 Vitamin D deficiency, unspecified: Secondary | ICD-10-CM

## 2021-08-18 DIAGNOSIS — D72819 Decreased white blood cell count, unspecified: Secondary | ICD-10-CM

## 2021-08-18 MED ORDER — LINACLOTIDE 145 MCG PO CAPS
145.0000 ug | ORAL_CAPSULE | Freq: Every day | ORAL | 11 refills | Status: DC
Start: 2021-08-18 — End: 2022-03-16

## 2021-08-18 NOTE — Patient Instructions (Addendum)
Dermatology Dr. Lanier Prude  [place]  [phone]   Consider high dose flu shot 09/20/21 Consider shingrix vaccine   Senna S Or  Miralax   Warm prune juice   More fiber benefiber/metamucil   More water    Ginger tea  Culturelle or align protobics or renew   Zoster Vaccine, Recombinant injection What is this medication? ZOSTER VACCINE (ZOS ter vak SEEN) is a vaccine used to reduce the risk of getting shingles. This vaccine is not used to treat shingles or nerve pain from shingles. This medicine may be used for other purposes; ask your health care provider or pharmacist if you have questions. COMMON BRAND NAME(S): [facility] What should I tell my care team before I take this medication? They need to know if you have any of these conditions: cancer immune system problems an unusual or allergic reaction to Zoster vaccine, other medications, foods, dyes, or preservatives pregnant or trying to get pregnant breast-feeding How should I use this medication? This vaccine is injected into a muscle. It is given by a health care provider. A copy of Vaccine Information Statements will be given before each vaccination. Be sure to read this information carefully each time. This sheet may change often. Talk to your health care provider about the use of this vaccine in children. This vaccine is not approved for use in children. Overdosage: If you think you have taken too much of this medicine contact a poison control center or emergency room at once. NOTE: This medicine is only for you. Do not share this medicine with others. What if I miss a dose? Keep appointments for follow-up (booster) doses. It is important not to miss your dose. Call your health care provider if you are unable to keep an appointment. What may interact with this medication? medicines that suppress your immune system medicines to treat cancer steroid medicines like prednisone or cortisone This list  may not describe all possible interactions. Give your health care provider a list of all the medicines, herbs, non-prescription drugs, or dietary supplements you use. Also tell them if you smoke, drink alcohol, or use illegal drugs. Some items may interact with your medicine. What should I watch for while using this medication? Visit your health care provider regularly. This vaccine, like all vaccines, may not fully protect everyone. What side effects may I notice from receiving this medication? Side effects that you should report to your doctor or health care professional as soon as possible: allergic reactions (skin rash, itching or hives; swelling of the face, lips, or tongue) trouble breathing Side effects that usually do not require medical attention (report these to your doctor or health care professional if they continue or are bothersome): chills headache fever nausea pain, redness, or irritation at site where injected tiredness vomiting This list may not describe all possible side effects. Call your doctor for medical advice about side effects. You may report side effects to FDA at 1-800-FDA-1088. Where should I keep my medication? This vaccine is only given by a health care provider. It will not be stored at home. NOTE: This sheet is a summary. It may not cover all possible information. If you have questions about this medicine, talk to your doctor, pharmacist, or health care provider.  2022 Elsevier/Gold Standard (2021-06-15 00:00:00)  Constipation, Adult Constipation is when a person has fewer than three bowel movements in a week, has difficulty having a bowel movement, or has stools (feces) that are dry, hard, or larger  than normal. Constipation may be caused by an underlying condition. It may become worse with age if a person takes certain medicines and does not take in enough fluids. Follow these instructions at home: Eating and drinking  Eat foods that have a lot of fiber,  such as beans, whole grains, and fresh fruits and vegetables. Limit foods that are low in fiber and high in fat and processed sugars, such as fried or sweet foods. These include french fries, hamburgers, cookies, candies, and soda. Drink enough fluid to keep your urine pale yellow. General instructions Exercise regularly or as told by your health care provider. Try to do 150 minutes of moderate exercise each week. Use the bathroom when you have the urge to go. Do not hold it in. Take over-the-counter and prescription medicines only as told by your health care provider. This includes any fiber supplements. During bowel movements: Practice deep breathing while relaxing the lower abdomen. Practice pelvic floor relaxation. Watch your condition for any changes. Let your health care provider know about them. Keep all follow-up visits as told by your health care provider. This is important. Contact a health care provider if: You have pain that gets worse. You have a fever. You do not have a bowel movement after 4 days. You vomit. You are not hungry or you lose weight. You are bleeding from the opening between the buttocks (anus). You have thin, pencil-like stools. Get help right away if: You have a fever and your symptoms suddenly get worse. You leak stool or have blood in your stool. Your abdomen is bloated. You have severe pain in your abdomen. You feel dizzy or you faint. Summary Constipation is when a person has fewer than three bowel movements in a week, has difficulty having a bowel movement, or has stools (feces) that are dry, hard, or larger than normal. Eat foods that have a lot of fiber, such as beans, whole grains, and fresh fruits and vegetables. Drink enough fluid to keep your urine pale yellow. Take over-the-counter and prescription medicines only as told by your health care provider. This includes any fiber supplements. This information is not intended to replace advice given  to you by your health care provider. Make sure you discuss any questions you have with your health care provider. Document Revised: 08/14/2019 Document Reviewed: 08/14/2019 Elsevier Patient Education  [place].

## 2021-08-18 NOTE — Progress Notes (Addendum)
Chief Complaint  Patient presents with   Follow-up    Pt c/o constipation and bloating x2-43months. Pt is using OTC stool softeners.    F/u  1. Chronic constipation tried linzess in the past 145 mcg and helps but out needs to drink more water tried otc colace worse x 2-3 months and felt bloated  2. Never f/u dermatology visit for brown spots to face and disc pe removal   Review of Systems  Constitutional:  Negative for weight loss.  HENT:  Negative for hearing loss.   Eyes:  Negative for blurred vision.  Respiratory:  Negative for shortness of breath.   Cardiovascular:  Negative for chest pain.  Gastrointestinal:  Negative for abdominal pain and blood in stool.  Genitourinary:  Negative for dysuria.  Musculoskeletal:  Negative for falls and joint pain.  Skin:  Negative for rash.  Neurological:  Negative for headaches.  Psychiatric/Behavioral:  Negative for depression.   Past Medical History:  Diagnosis Date   COVID-19    Fibroids    confrimed from medical records   GERD (gastroesophageal reflux disease)    Heart murmur    Sickle cell trait [facility])    Past Surgical History:  Procedure Laterality Date   BREAST BIOPSY Right 01/29/2021   Affirm bx-"X" clip-path pending   BREAST BIOPSY Right 03/02/2021   Sffirm bx- coil clip, path pending    BREAST CYST EXCISION Right 50 years ago ~1970   neg   BREAST SURGERY     LAPAROSCOPIC ASSISTED VAGINAL HYSTERECTOMY  11/18/2009   partial hysterectomy only ovaries left no h/o h/o abnormal pap    TUBAL LIGATION     1985   Family History  Problem Relation Age of Onset   Cancer Maternal Aunt        ?lung   Hypertension Maternal Grandmother    Cancer Maternal Grandfather        ?type   Breast cancer Cousin    Cancer Cousin        uterine   Cancer Sister        jaw    Heart attack Brother    Hypertension Other        siblings    Cancer Brother        GU cancer   Cancer Maternal Aunt        ?lung   Social History    Socioeconomic History   Marital status: Married    Spouse name: Not on file   Number of children: Not on file   Years of education: Not on file   Highest education level: Not on file  Occupational History   Not on file  Tobacco Use   Smoking status: Never   Smokeless tobacco: Never  Substance and Sexual Activity   Alcohol use: Not Currently   Drug use: Never   Sexual activity: Not Currently  Other Topics Concern   Not on file  Social History Narrative   7 siblings, 4 living as of 02/12/21    Retired Producer, television/film/video    2 female kids 45, 28 02/12/21    Grew up [place] moved to [employer] 2nd grade relocated here [employer] eduation   Social Determinants of Health   Financial Resource Strain: Not on file  Food Insecurity: Not on file  Transportation Needs: Not on file  Physical Activity: Not on file  Stress: Not on file  Social Connections: Not on file  Intimate Partner Violence: Not on file  Current Meds  Medication Sig   Calcium Carbonate-Vitamin D 600-400 MG-UNIT tablet Take 1 tablet by mouth daily.   fluticasone (FLONASE) 50 MCG/ACT nasal spray Place 2 sprays into both nostrils daily.   Iron, Ferrous Sulfate, 325 (65 Fe) MG TABS    levocetirizine (XYZAL) 5 MG tablet Take 1 tablet (5 mg total) by mouth every evening.   Multiple Vitamins-Minerals (CENTRUM SILVER PO) Take by mouth.   [DISCONTINUED] linaclotide (LINZESS) 145 MCG CAPS capsule Take 145 mcg by mouth daily before breakfast.   Allergies  Allergen Reactions   Sulfa Antibiotics Swelling    Facial Swelling   Demerol [Meperidine Hcl] Rash   No results found for this or any previous visit (from the past 2160 hour(s)). Objective  Body mass index is 25 kg/m. Wt Readings from Last 3 Encounters:  08/18/21 154 lb 12.8 oz (70.2 kg)  03/05/21 152 lb (68.9 kg)  02/16/21 153 lb (69.4 kg)   Temp Readings from Last 3 Encounters:  08/18/21 (!) 96.9 F (36.1 C)  02/12/21 98.5 F (36.9 C) (Oral)  12/23/20 98 F (36.7  C) (Oral)   BP Readings from Last 3 Encounters:  08/18/21 118/80  03/05/21 128/76  02/16/21 136/79   Pulse Readings from Last 3 Encounters:  08/18/21 78  03/05/21 83  02/16/21 80    Physical Exam Vitals and nursing note reviewed.  Constitutional:      Appearance: Normal appearance. She is well-developed and well-groomed.  HENT:     Head: Normocephalic and atraumatic.  Eyes:     Conjunctiva/sclera: Conjunctivae normal.     Pupils: Pupils are equal, round, and reactive to light.  Cardiovascular:     Rate and Rhythm: Normal rate and regular rhythm.     Heart sounds: Normal heart sounds. No murmur heard. Pulmonary:     Effort: Pulmonary effort is normal.     Breath sounds: Normal breath sounds.  Abdominal:     General: Abdomen is flat. Bowel sounds are normal.     Tenderness: There is no abdominal tenderness.  Musculoskeletal:        General: No tenderness.  Skin:    General: Skin is warm and dry.  Neurological:     General: No focal deficit present.     Mental Status: She is alert and oriented to person, place, and time. Mental status is at baseline.     Cranial Nerves: Cranial nerves 2-12 are intact.     Gait: Gait is intact.  Psychiatric:        Attention and Perception: Attention and perception normal.        Mood and Affect: Mood and affect normal.        Speech: Speech normal.        Behavior: Behavior normal. Behavior is cooperative.        Thought Content: Thought content normal.        Cognition and Memory: Cognition and memory normal.        Judgment: Judgment normal.    Assessment  Plan  Irritable bowel syndrome with constipation - Plan: linaclotide (LINZESS) 145 MCG CAPS capsule, Comprehensive metabolic panel  Constipation, unspecified constipation type - Plan: linaclotide (LINZESS) 145 MCG CAPS capsule, Comprehensive metabolic panel  Dermatosis papulosa nigra - Plan: Ambulatory referral to Dermatology Seborrheic keratosis - Plan: Ambulatory referral  to Dermatology    Kidney cysts ordered renal US    Leukopenia and microcytosis    Annual physical exam Not had flu shot since 2017  Tdap  utd had 02/18/21  Consider shingrix  (given Rx today) disc prevnar and pna 23  Pfizer 4/4 consider booster    Pap 4 years ago and ob/gyn stated in 2022 no further paps p. Hysterectomy fibroids ovaries intact no h/o abnormal pap  -[facility] seen 01/21/21   Colonoscopy/EGD 2019 ROI sent prior PCP Dr. Posey Pronto had in [place]    mammo right bx 01/29/21 rec re bx calcification, rec re-bx in 2-3 weeks pt wants this done in 1 month right breast re-bx sch 5/24/2 re bx of breast right  Ordered again   dexa consider age 63 ordered again   Hep C negative    ROI sent prior PCP Dr. Posey Pronto in [place] need colonoscopy, echo   rec healthy diet and exercise    Vascular abnormality 01/20/21 left ankle [place] Pending f/u vascular  IMPRESSION: Nonspecific 3.0 x 1.7 x 0.7 cm hypoechoic and septated lesion corresponding to the patient identified site of palpable abnormality, deformable with compression and demonstrating some evidence of internal Doppler flow. 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   Skin cancer screening Seborrheic keratosis - Plan: Ambulatory referral to Dermatology Dr. Lanier Prude Dermatosis papulosa nigra - Plan: Ambulatory referral to Dermatology   Heart murmur ROI Dr Posey Pronto prior echo    Specialists  [facility] Referral placed [facility]  Dermatology Dr. Lanier Prude  Mammogram [place]    Provider: Dr. Olivia Mackie McLean-Scocuzza-Internal Medicine

## 2021-08-27 ENCOUNTER — Telehealth: Payer: Self-pay

## 2021-08-27 NOTE — Telephone Encounter (Signed)
LMTCB to let patient know that PA for Linzess was approved.

## 2021-08-30 ENCOUNTER — Telehealth: Payer: Self-pay

## 2021-08-30 NOTE — Telephone Encounter (Signed)
Linzess approved 07/28/21-08/27/22

## 2021-09-01 DIAGNOSIS — L821 Other seborrheic keratosis: Secondary | ICD-10-CM | POA: Diagnosis not present

## 2021-11-04 ENCOUNTER — Other Ambulatory Visit: Payer: Self-pay | Admitting: Adult Health

## 2021-11-04 DIAGNOSIS — J302 Other seasonal allergic rhinitis: Secondary | ICD-10-CM

## 2021-12-02 IMAGING — US US EXTREM LOW*L* LIMITED
1 series · 14 of 21 positions shown · non-contrast
Comparison: None.

CLINICAL DATA: Anterior left ankle swelling for 2 years

EXAM:
ULTRASOUND LEFT LOWER EXTREMITY LIMITED
TECHNIQUE: Ultrasound examination of the lower extremity soft tissues was
performed in the area of clinical concern.

[Series 1: us left lower extrem ltd soft tissue non vascular · 21 acquisitions, 14 frames shown]
[im 1/21]
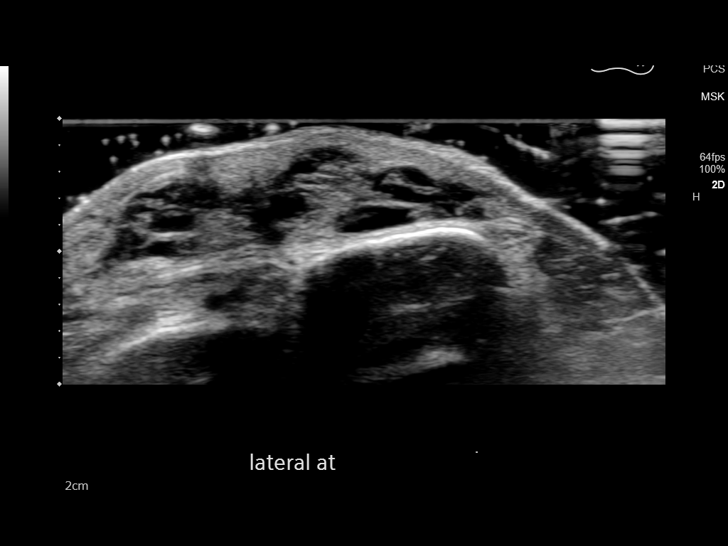
[im 3/21]
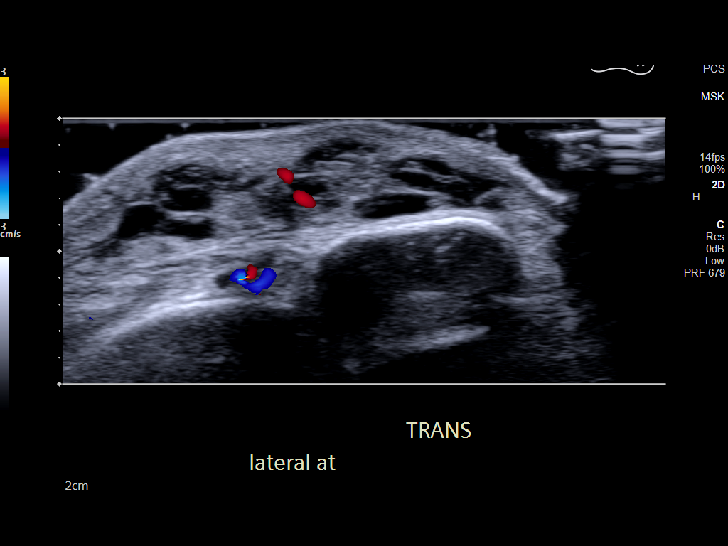
[im 4/21]
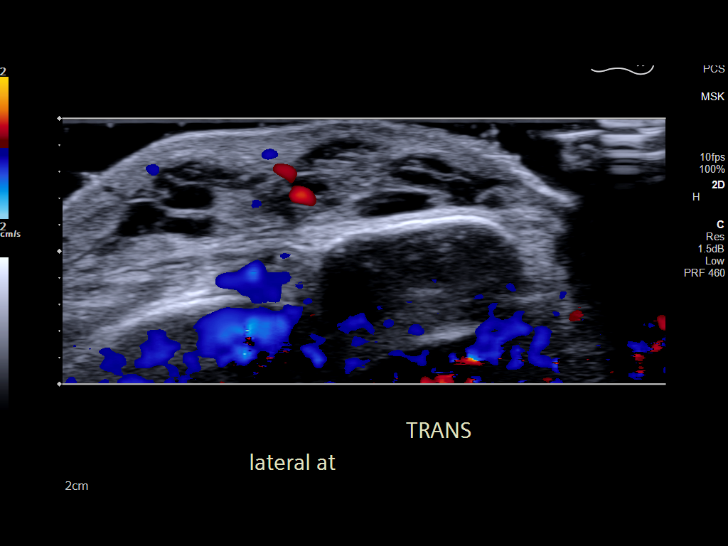
[im 6/21]
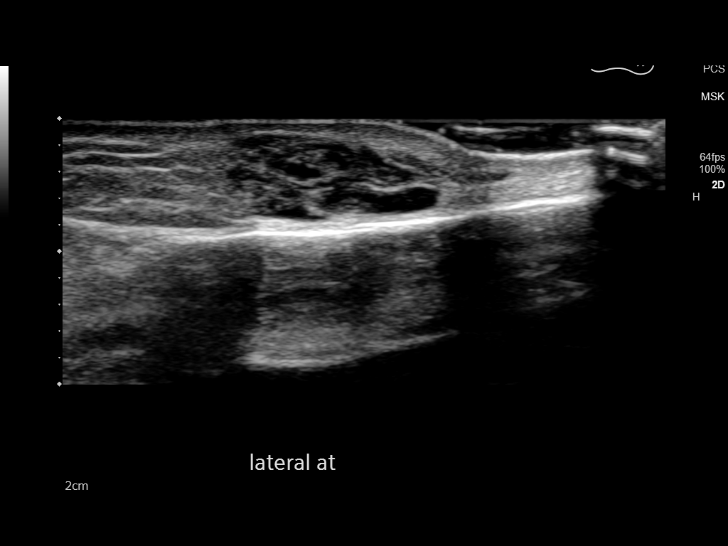
[im 7/21]
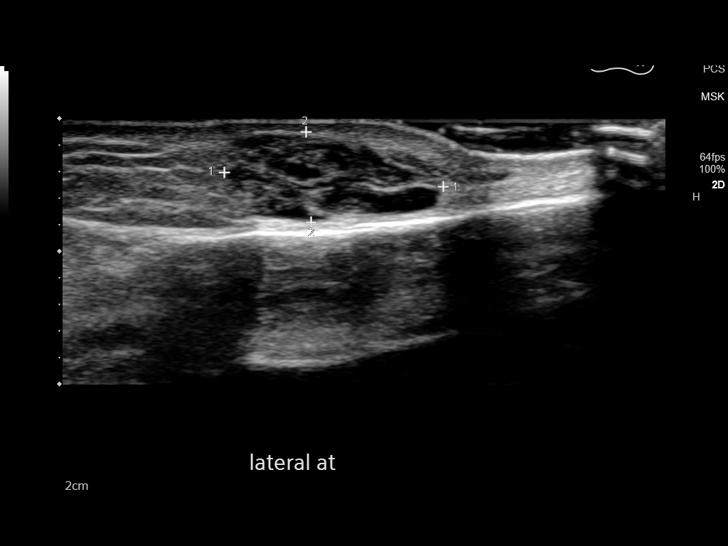
[im 9/21]
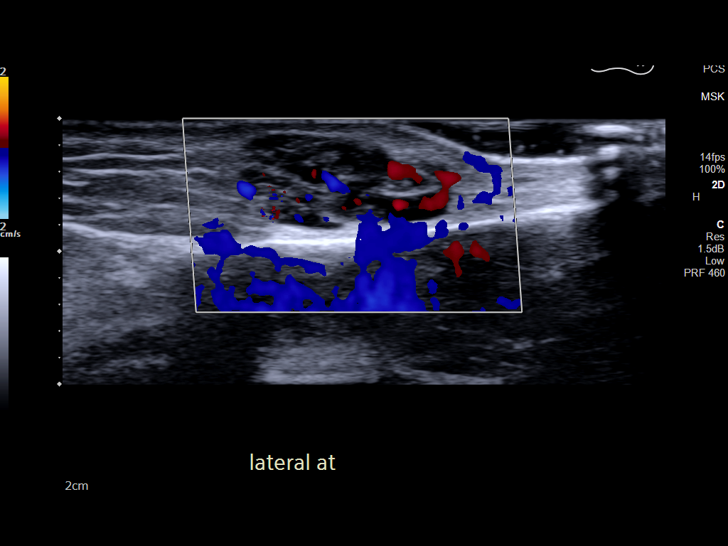
[im 10/21]
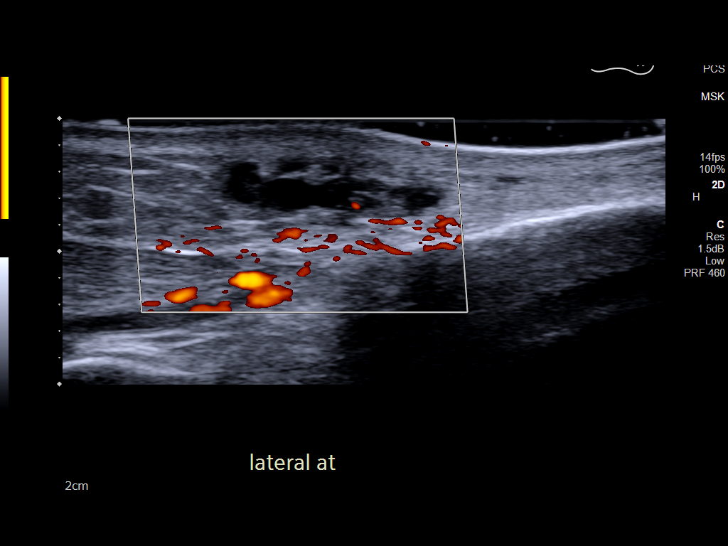
[im 12/21]
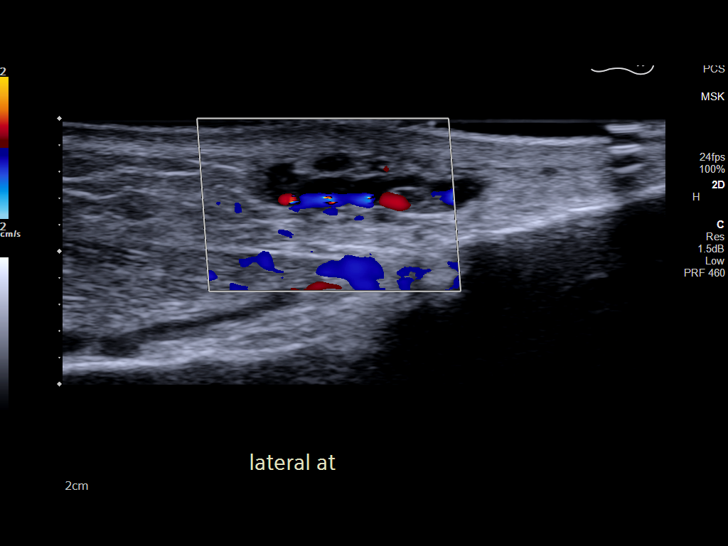
[im 13/21]
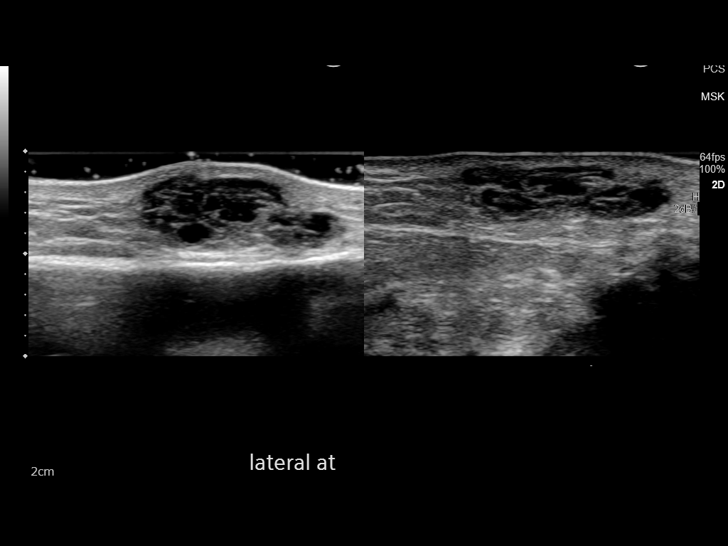
[im 15/21]
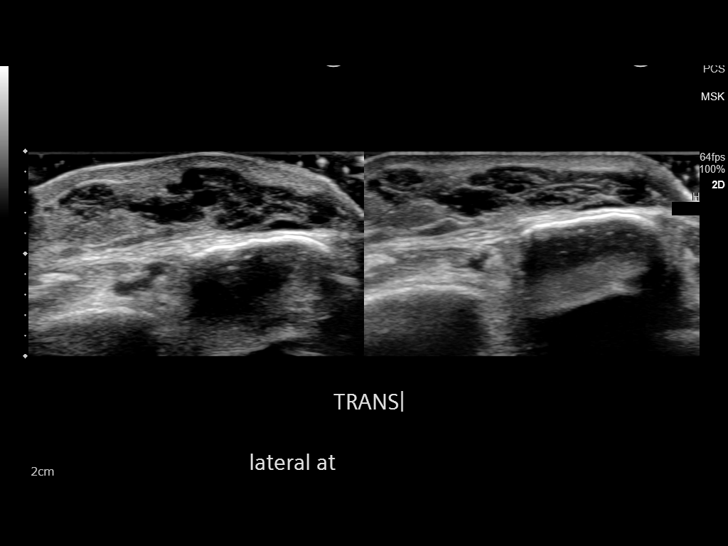
[im 16/21]
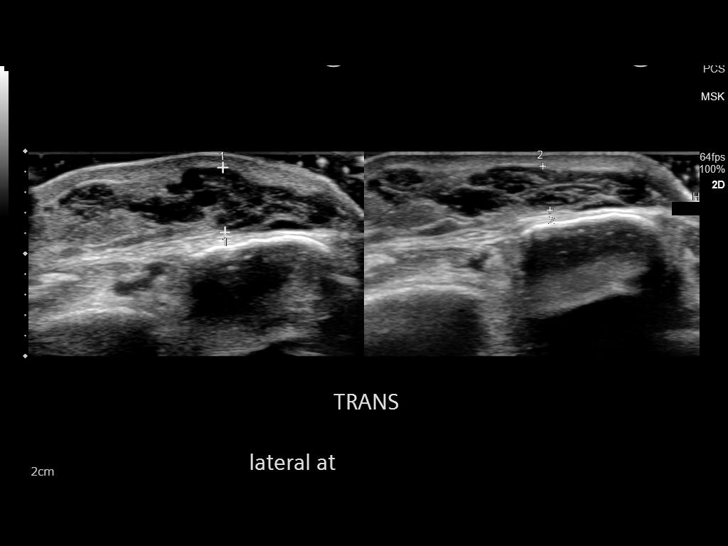
[im 18/21]
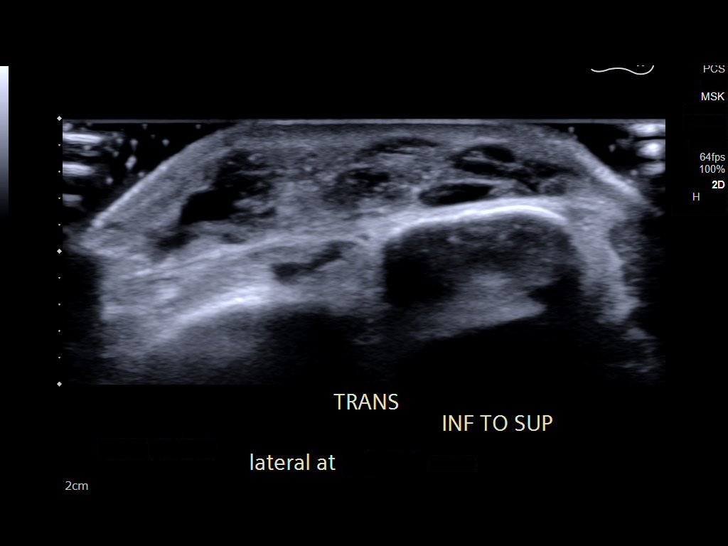
[im 19/21]
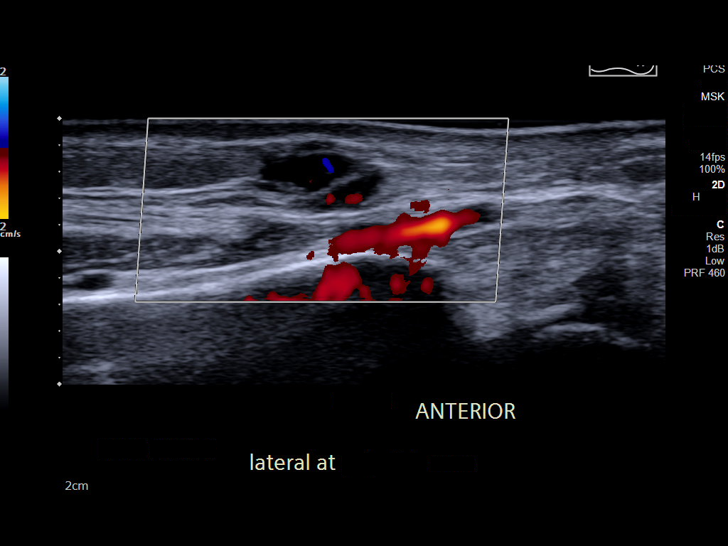
[im 21/21]
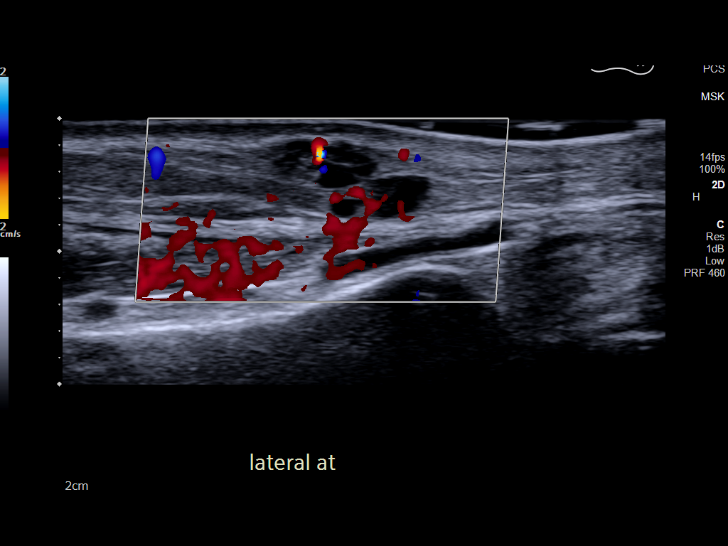

[14 of 21 positions shown; findings below may reference images not displayed]

FINDINGS: Targeted ultrasound examination of the lateral left ankle at the
patient identified site of palpable abnormality reveals a very
superficial hypoechoic and septated lesion measuring 3.0 x 1.7 x
cm, deformable with compression and demonstrating some evidence of
internal Doppler flow.
IMPRESSION: Nonspecific 3.0 x 1.7 x 0.7 cm hypoechoic and septated lesion
corresponding to the patient identified site of palpable
abnormality, deformable with compression and demonstrating some
evidence of internal Doppler flow. This likely reflects a small
lymphovascular malformation.

## 2021-12-23 ENCOUNTER — Other Ambulatory Visit: Payer: Self-pay

## 2021-12-23 ENCOUNTER — Other Ambulatory Visit (INDEPENDENT_AMBULATORY_CARE_PROVIDER_SITE_OTHER): Payer: Medicare Other

## 2021-12-23 DIAGNOSIS — Z1329 Encounter for screening for other suspected endocrine disorder: Secondary | ICD-10-CM

## 2021-12-23 DIAGNOSIS — Z1389 Encounter for screening for other disorder: Secondary | ICD-10-CM | POA: Diagnosis not present

## 2021-12-23 DIAGNOSIS — K5900 Constipation, unspecified: Secondary | ICD-10-CM | POA: Diagnosis not present

## 2021-12-23 DIAGNOSIS — R718 Other abnormality of red blood cells: Secondary | ICD-10-CM | POA: Diagnosis not present

## 2021-12-23 DIAGNOSIS — Z13818 Encounter for screening for other digestive system disorders: Secondary | ICD-10-CM

## 2021-12-23 DIAGNOSIS — K581 Irritable bowel syndrome with constipation: Secondary | ICD-10-CM

## 2021-12-23 DIAGNOSIS — E559 Vitamin D deficiency, unspecified: Secondary | ICD-10-CM

## 2021-12-23 LAB — CBC WITH DIFFERENTIAL/PLATELET
Basophils Absolute: 0 10*3/uL (ref 0.0–0.1)
Basophils Relative: 0.9 % (ref 0.0–3.0)
Eosinophils Absolute: 0.2 10*3/uL (ref 0.0–0.7)
Eosinophils Relative: 7.7 % — ABNORMAL HIGH (ref 0.0–5.0)
HCT: 38.9 % (ref 36.0–46.0)
Hemoglobin: 12.4 g/dL (ref 12.0–15.0)
Lymphocytes Relative: 52.3 % — ABNORMAL HIGH (ref 12.0–46.0)
Lymphs Abs: 1.5 10*3/uL (ref 0.7–4.0)
MCHC: 31.9 g/dL (ref 30.0–36.0)
MCV: 74.1 fl — ABNORMAL LOW (ref 78.0–100.0)
Monocytes Absolute: 0.3 10*3/uL (ref 0.1–1.0)
Monocytes Relative: 9.2 % (ref 3.0–12.0)
Neutro Abs: 0.9 10*3/uL — ABNORMAL LOW (ref 1.4–7.7)
Neutrophils Relative %: 29.9 % — ABNORMAL LOW (ref 43.0–77.0)
Platelets: 244 10*3/uL (ref 150.0–400.0)
RBC: 5.25 Mil/uL — ABNORMAL HIGH (ref 3.87–5.11)
RDW: 15.5 % (ref 11.5–15.5)
WBC: 2.9 10*3/uL — ABNORMAL LOW (ref 4.0–10.5)

## 2021-12-23 LAB — COMPREHENSIVE METABOLIC PANEL
ALT: 15 U/L (ref 0–35)
AST: 19 U/L (ref 0–37)
Albumin: 4.5 g/dL (ref 3.5–5.2)
Alkaline Phosphatase: 64 U/L (ref 39–117)
BUN: 9 mg/dL (ref 6–23)
CO2: 29 mEq/L (ref 19–32)
Calcium: 9.3 mg/dL (ref 8.4–10.5)
Chloride: 105 mEq/L (ref 96–112)
Creatinine, Ser: 0.79 mg/dL (ref 0.40–1.20)
GFR: 78.59 mL/min (ref >60.00)
Glucose, Bld: 98 mg/dL (ref 70–99)
Potassium: 4.5 mEq/L (ref 3.5–5.1)
Sodium: 140 mEq/L (ref 135–145)
Total Bilirubin: 0.4 mg/dL (ref 0.2–1.2)
Total Protein: 7.4 g/dL (ref 6.0–8.3)

## 2021-12-23 LAB — LIPID PANEL
Cholesterol: 149 mg/dL (ref 0–200)
HDL: 58.8 mg/dL (ref >39.00)
LDL Cholesterol: 79 mg/dL (ref 0–99)
NonHDL: 89.96
Total CHOL/HDL Ratio: 3
Triglycerides: 56 mg/dL (ref 0.0–149.0)
VLDL: 11.2 mg/dL (ref 0.0–40.0)

## 2021-12-23 LAB — TSH: TSH: 3.49 u[IU]/mL (ref 0.35–5.50)

## 2021-12-23 LAB — VITAMIN D 25 HYDROXY (VIT D DEFICIENCY, FRACTURES): VITD: 31.9 ng/mL (ref 30.00–100.00)

## 2021-12-24 LAB — URINALYSIS, ROUTINE W REFLEX MICROSCOPIC
Bilirubin, UA: NEGATIVE
Glucose, UA: NEGATIVE
Ketones, UA: NEGATIVE
Nitrite, UA: NEGATIVE
Protein,UA: NEGATIVE
RBC, UA: NEGATIVE
Specific Gravity, UA: 1.015 (ref 1.005–1.030)
Urobilinogen, Ur: 0.2 mg/dL (ref 0.2–1.0)
pH, UA: 6.5 (ref 5.0–7.5)

## 2021-12-24 LAB — MICROSCOPIC EXAMINATION
Bacteria, UA: NONE SEEN
Casts: NONE SEEN /lpf

## 2021-12-28 ENCOUNTER — Encounter: Payer: Self-pay | Admitting: Internal Medicine

## 2021-12-29 ENCOUNTER — Encounter: Payer: Self-pay | Admitting: Internal Medicine

## 2021-12-29 ENCOUNTER — Other Ambulatory Visit: Payer: Self-pay | Admitting: Internal Medicine

## 2021-12-29 DIAGNOSIS — N3000 Acute cystitis without hematuria: Secondary | ICD-10-CM

## 2021-12-29 NOTE — Telephone Encounter (Signed)
Added to 12/28/21 Patient message encounter and routed to provider  ?

## 2021-12-29 NOTE — Telephone Encounter (Signed)
Kim Mitchell ?to P [facility] (supporting McLean-Scocuzza, Nino Glow, MD)   ?  12:15 AM ?Checking to see if I need to check my left kidney again to make sure the small cyst has not grown? I?m having some discomfort in my lower left side. Maybe it?s just an UTI? ?

## 2021-12-31 ENCOUNTER — Other Ambulatory Visit: Payer: Self-pay

## 2021-12-31 ENCOUNTER — Other Ambulatory Visit: Payer: Federal, State, Local not specified - PPO

## 2021-12-31 DIAGNOSIS — N3000 Acute cystitis without hematuria: Secondary | ICD-10-CM

## 2022-01-01 LAB — URINE CULTURE
MICRO NUMBER:: 13176740
SPECIMEN QUALITY:: ADEQUATE

## 2022-01-07 ENCOUNTER — Encounter: Payer: Self-pay | Admitting: *Deleted

## 2022-01-12 ENCOUNTER — Ambulatory Visit
Admission: RE | Admit: 2022-01-12 | Discharge: 2022-01-12 | Disposition: A | Discharge status: Home / Self Care | Payer: Medicare Other | Source: Ambulatory Visit | Attending: Internal Medicine | Admitting: Internal Medicine

## 2022-01-12 DIAGNOSIS — N281 Cyst of kidney, acquired: Secondary | ICD-10-CM | POA: Insufficient documentation

## 2022-01-12 IMAGING — MG MM BREAST BX W/ LOC DEV 1ST LESION IMAGE BX SPEC STEREO GUIDE*R*
8 of 14 series · 8 of 30 positions shown · non-contrast
Comparison: Previous exams.
COMPARISON: Previous exams.

Addendum:
CLINICAL DATA: Patient presents for stereotactic guided core biopsy
of the RIGHT breast. Was recommended that the patient return for
repeat biopsy after stereotactic guided core biopsy performed
01/29/2021 was inconclusive.

EXAM:
RIGHT BREAST STEREOTACTIC CORE NEEDLE BIOPSY

[R (1 of 3)]
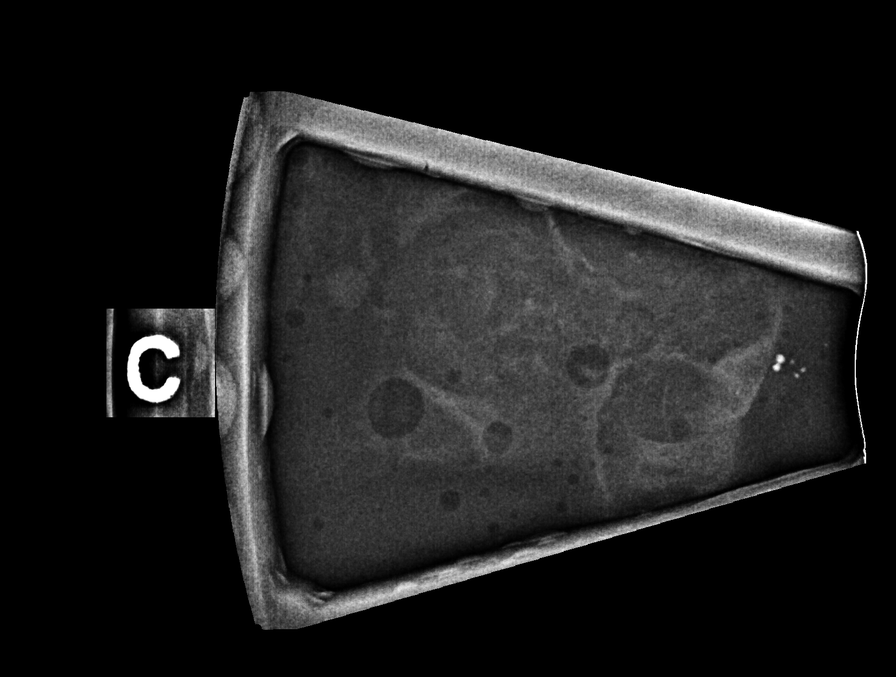

[R (2 of 3)]
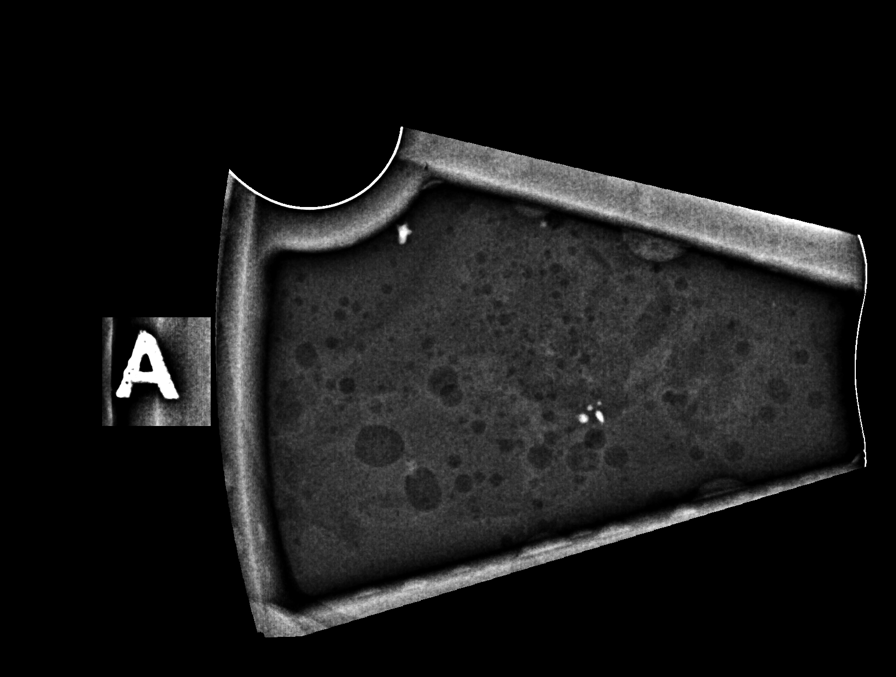

[R (3 of 3)]
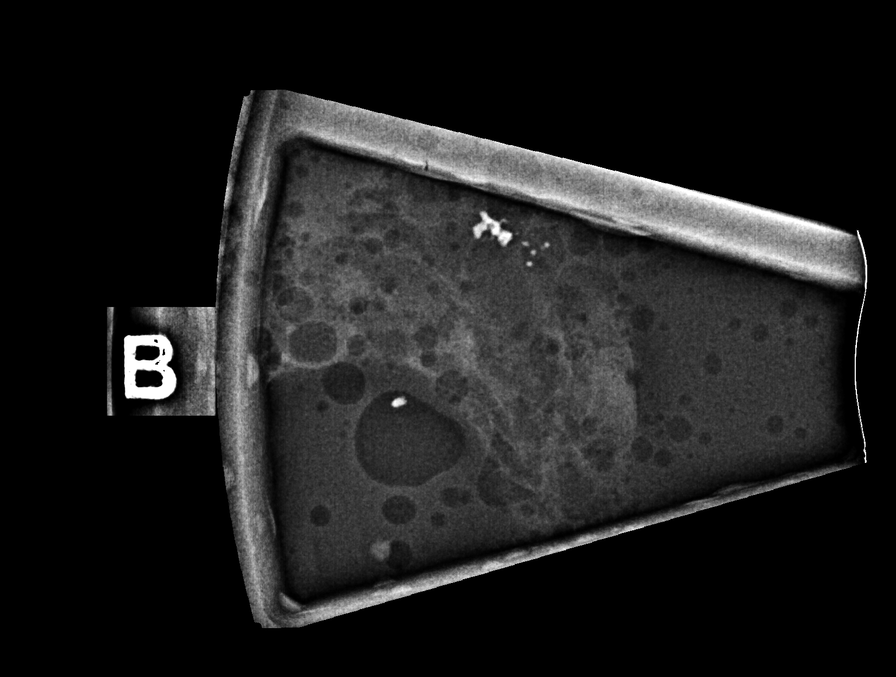

[R CC (1 of 5)]
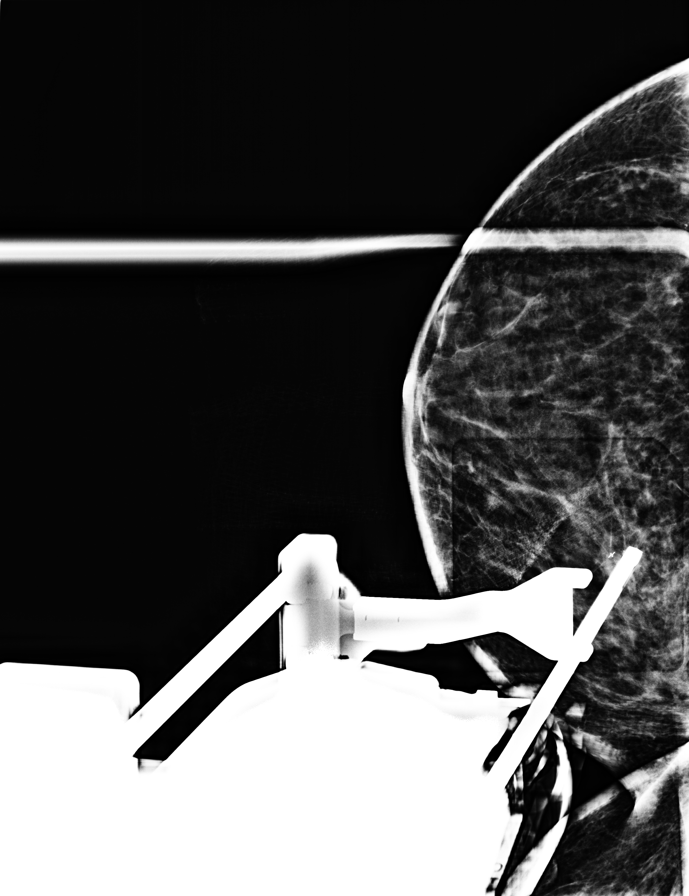

[R CC (2 of 5)]
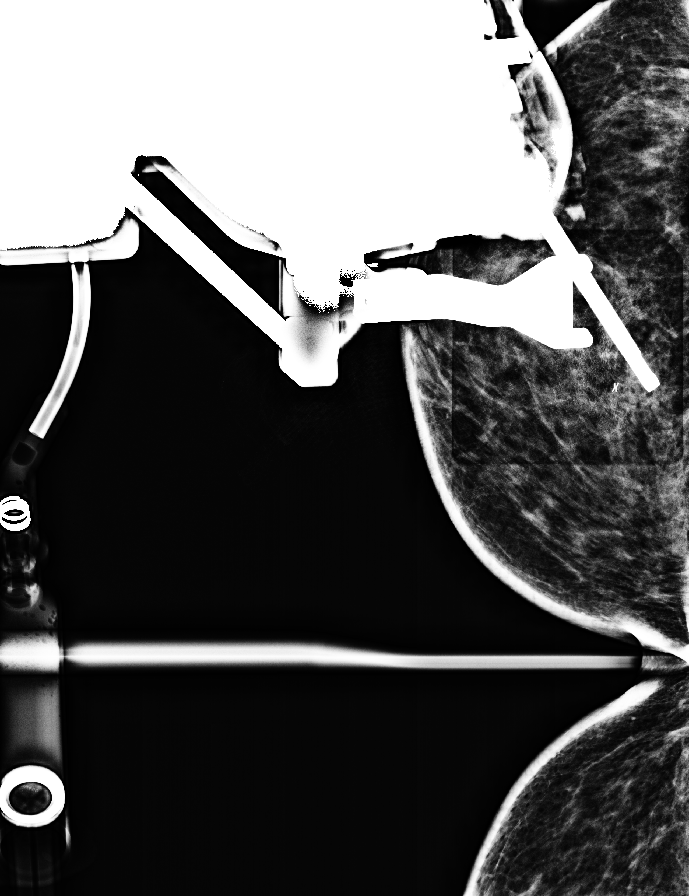

[R CC (3 of 5)]
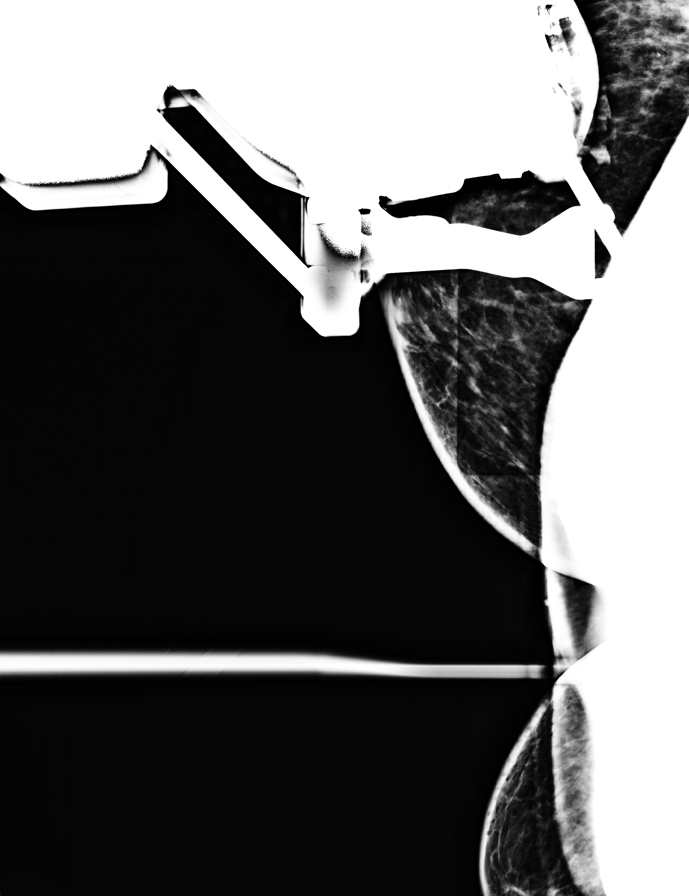

[R CC (4 of 5)]
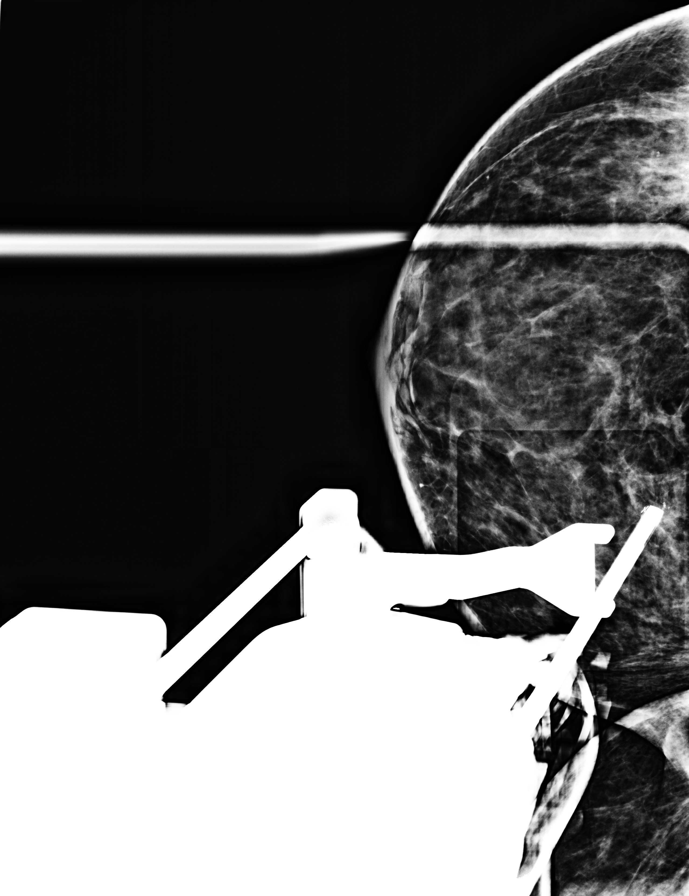

[R CC (5 of 5)]
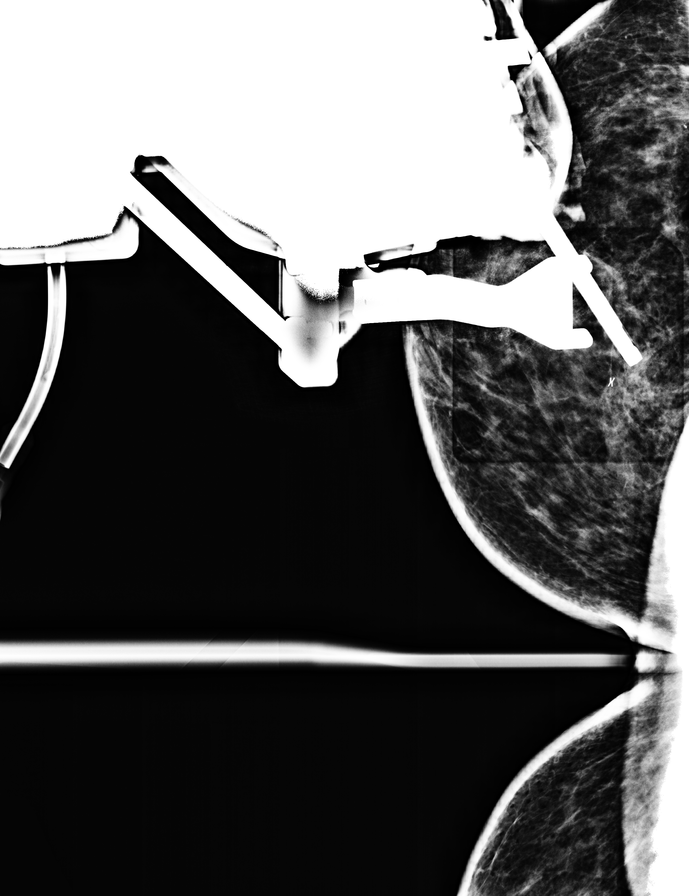

[8 of 30 positions shown; findings below may reference images not displayed]



Using sterile technique and 1% lidocaine and 1% lidocaine with
epinephrine as local anesthetic, under stereotactic guidance, a 9
gauge vacuum assisted device was used to perform core needle biopsy
of calcifications in the UPPER INNER QUADRANT of the RIGHT breast
using a craniocaudal approach. Specimen radiograph was performed
showing calcifications in tissue samples. Specimens with
calcifications are identified for pathology.

Lesion quadrant: UPPER INNER QUADRANT RIGHT breast

At the conclusion of the procedure, coil shaped tissue marker clip
was deployed into the biopsy cavity. Follow-up 2-view mammogram was
performed and dictated separately.
IMPRESSION: Stereotactic-guided biopsy of RIGHT breast calcifications. No
apparent complications.

ADDENDUM:
PATHOLOGY revealed: A. BREAST, RIGHT UPPER INNER QUADRANT;
STEREOTACTIC CORE NEEDLE BIOPSY: - BENIGN FIBROFATTY TISSUE WITH
DYSTROPHIC CALCIFICATION/OSSIFICATION, FIBROSIS, AND FAT NECROSIS. -
NO DEFINITE MAMMARY EPITHELIAL ELEMENTS IDENTIFIED. - NEGATIVE FOR
ATYPIA AND MALIGNANCY. Comment: Additional deeper recut levels were
performed. The histologic findings are similar to those reported in
the previous biopsy ([id number]).

Pathology results are CONCORDANT with imaging findings, per Dr.
Sirenita Marable.

Pathology results and recommendations were discussed with patient
via telephone on 03/04/2021. Patient reported doing well after the
biopsy with no adverse symptoms, and only slight tenderness at the
site. Post biopsy care instructions were reviewed, questions were
answered and my direct phone number was provided. Patient was
encouraged to call [HOSPITAL] for any additional
questions or concerns related to biopsy site.

Recommendation: Resume annual bilateral screening mammogram due
January 2022.

Pathology results reported by Ghzl Bagui RN on 03/04/2021.



Using sterile technique and 1% lidocaine and 1% lidocaine with
epinephrine as local anesthetic, under stereotactic guidance, a 9
gauge vacuum assisted device was used to perform core needle biopsy
of calcifications in the UPPER INNER QUADRANT of the RIGHT breast
using a craniocaudal approach. Specimen radiograph was performed
showing calcifications in tissue samples. Specimens with
calcifications are identified for pathology.

Lesion quadrant: UPPER INNER QUADRANT RIGHT breast

At the conclusion of the procedure, coil shaped tissue marker clip
was deployed into the biopsy cavity. Follow-up 2-view mammogram was
performed and dictated separately.
IMPRESSION: Stereotactic-guided biopsy of RIGHT breast calcifications. No
apparent complications.

## 2022-01-14 ENCOUNTER — Inpatient Hospital Stay: Payer: Medicare Other | Admitting: Oncology

## 2022-01-14 ENCOUNTER — Inpatient Hospital Stay: Payer: Medicare Other

## 2022-01-24 ENCOUNTER — Inpatient Hospital Stay: Payer: Medicare Other

## 2022-01-24 ENCOUNTER — Encounter: Payer: Self-pay | Admitting: Oncology

## 2022-01-24 ENCOUNTER — Inpatient Hospital Stay: Payer: Medicare Other | Attending: Oncology | Admitting: Oncology

## 2022-01-24 VITALS — BP 115/80 | HR 88 | Temp 97.4°F | Resp 16 | Ht 65.0 in | Wt 150.6 lb

## 2022-01-24 DIAGNOSIS — Z8616 Personal history of COVID-19: Secondary | ICD-10-CM

## 2022-01-24 DIAGNOSIS — D708 Other neutropenia: Secondary | ICD-10-CM

## 2022-01-24 DIAGNOSIS — K219 Gastro-esophageal reflux disease without esophagitis: Secondary | ICD-10-CM | POA: Diagnosis not present

## 2022-01-24 DIAGNOSIS — D72819 Decreased white blood cell count, unspecified: Secondary | ICD-10-CM | POA: Diagnosis present

## 2022-01-24 DIAGNOSIS — Z885 Allergy status to narcotic agent status: Secondary | ICD-10-CM

## 2022-01-24 DIAGNOSIS — M25572 Pain in left ankle and joints of left foot: Secondary | ICD-10-CM

## 2022-01-24 DIAGNOSIS — Z8249 Family history of ischemic heart disease and other diseases of the circulatory system: Secondary | ICD-10-CM | POA: Diagnosis not present

## 2022-01-24 DIAGNOSIS — Z801 Family history of malignant neoplasm of trachea, bronchus and lung: Secondary | ICD-10-CM | POA: Diagnosis not present

## 2022-01-24 DIAGNOSIS — E559 Vitamin D deficiency, unspecified: Secondary | ICD-10-CM | POA: Diagnosis not present

## 2022-01-24 DIAGNOSIS — R638 Other symptoms and signs concerning food and fluid intake: Secondary | ICD-10-CM

## 2022-01-24 DIAGNOSIS — Z803 Family history of malignant neoplasm of breast: Secondary | ICD-10-CM

## 2022-01-24 DIAGNOSIS — Z79899 Other long term (current) drug therapy: Secondary | ICD-10-CM

## 2022-01-24 DIAGNOSIS — Z8049 Family history of malignant neoplasm of other genital organs: Secondary | ICD-10-CM

## 2022-01-24 DIAGNOSIS — Z808 Family history of malignant neoplasm of other organs or systems: Secondary | ICD-10-CM

## 2022-01-24 DIAGNOSIS — K589 Irritable bowel syndrome without diarrhea: Secondary | ICD-10-CM

## 2022-01-24 DIAGNOSIS — Z882 Allergy status to sulfonamides status: Secondary | ICD-10-CM | POA: Diagnosis not present

## 2022-01-24 LAB — CBC WITH DIFFERENTIAL/PLATELET
Abs Immature Granulocytes: 0 10*3/uL (ref 0.00–0.07)
Basophils Absolute: 0 10*3/uL (ref 0.0–0.1)
Basophils Relative: 1 %
Eosinophils Absolute: 0.2 10*3/uL (ref 0.0–0.5)
Eosinophils Relative: 7 %
HCT: 40.1 % (ref 36.0–46.0)
Hemoglobin: 12.9 g/dL (ref 12.0–15.0)
Immature Granulocytes: 0 %
Lymphocytes Relative: 44 %
Lymphs Abs: 1.6 10*3/uL (ref 0.7–4.0)
MCH: 23.5 pg — ABNORMAL LOW (ref 26.0–34.0)
MCHC: 32.2 g/dL (ref 30.0–36.0)
MCV: 73.2 fL — ABNORMAL LOW (ref 80.0–100.0)
Monocytes Absolute: 0.4 10*3/uL (ref 0.1–1.0)
Monocytes Relative: 11 %
Neutro Abs: 1.3 10*3/uL — ABNORMAL LOW (ref 1.7–7.7)
Neutrophils Relative %: 37 %
Platelets: 237 10*3/uL (ref 150–400)
RBC: 5.48 MIL/uL — ABNORMAL HIGH (ref 3.87–5.11)
RDW: 15.3 % (ref 11.5–15.5)
WBC: 3.5 10*3/uL — ABNORMAL LOW (ref 4.0–10.5)
nRBC: 0 % (ref 0.0–0.2)

## 2022-01-24 LAB — TECHNOLOGIST SMEAR REVIEW
Plt Morphology: NORMAL
RBC MORPHOLOGY: NORMAL
WBC MORPHOLOGY: NORMAL

## 2022-01-24 LAB — FERRITIN: Ferritin: 160 ng/mL (ref 11–307)

## 2022-01-24 LAB — VITAMIN B12: Vitamin B-12: 503 pg/mL (ref 180–914)

## 2022-01-24 LAB — IRON AND TIBC
Iron: 109 ug/dL (ref 28–170)
Saturation Ratios: 37 % — ABNORMAL HIGH (ref 10.4–31.8)
TIBC: 293 ug/dL (ref 250–450)
UIBC: 184 ug/dL

## 2022-01-24 LAB — FOLATE: Folate: 28 ng/mL (ref >5.9)

## 2022-01-24 NOTE — Progress Notes (Signed)
? ?Hematology/Oncology Consult note ?[place] ?[phone] [phone] ? ?Patient Care Team: ?McLean-Scocuzza, Nino Glow, MD as PCP - General (Internal Medicine) ?Sindy Guadeloupe, MD as Consulting Physician (Hematology and Oncology)  ? ?Name of the patient: Kim Mitchell  ?[id number]  ?12-22-1955  ? ? ?Reason for referral-leukopenia ?  ?Referring physician- Dr. Terese Door ? ?Date of visit: 01/24/22 ? ? ?History of presenting illness patient is a 66 year old female with a past medical history of GERD, irritable bowel syndrome who has been referred to [place] for leukopenia.  Her most recent CBC from 12/23/2021 showed a white count of 2.9, H&H of 12.4/38.9 with an MCV of 74 and a platelet count of 244.  ANC was low at 0.9.  Looking back at her prior CBCs her white count was 4 with a ANC of 1.5 in March 2022 and a white count of 3.4 with an [place] of 1.1 in March 2021.  Prior hepatitis testing, HIV testing, rheumatoid factor and autoimmune work-up has been negative ? ?Patient reports feeling well overall.  Denies any recurrent infections or hospitalizations.  Appetite and weight have remained stable.   ? ?ECOG PS- 0 ? ?Pain scale- 0 ? ? ?Review of systems- Review of Systems  ?Constitutional:  Negative for chills, fever, malaise/fatigue and weight loss.  ?HENT:  Negative for congestion, ear discharge and nosebleeds.   ?Eyes:  Negative for blurred vision.  ?Respiratory:  Negative for cough, hemoptysis, sputum production, shortness of breath and wheezing.   ?Cardiovascular:  Negative for chest pain, palpitations, orthopnea and claudication.  ?Gastrointestinal:  Negative for abdominal pain, blood in stool, constipation, diarrhea, heartburn, melena, nausea and vomiting.  ?Genitourinary:  Negative for dysuria, flank pain, frequency, hematuria and urgency.  ?Musculoskeletal:  Negative for back pain, joint pain and myalgias.  ?Skin:  Negative for rash.  ?Neurological:  Negative for  dizziness, tingling, focal weakness, seizures, weakness and headaches.  ?Endo/Heme/Allergies:  Does not bruise/bleed easily.  ?Psychiatric/Behavioral:  Negative for depression and suicidal ideas. The patient does not have insomnia.   ? ?Allergies  ?Allergen Reactions  ? Sulfa Antibiotics Swelling  ?  Facial Swelling  ? Demerol [Meperidine Hcl] Rash  ? ? ?Patient Active Problem List  ? Diagnosis Date Noted  ? Dermatosis papulosa nigra 03/01/2021  ? Seborrheic keratosis 03/01/2021  ? Annual physical exam 03/01/2021  ? Heart murmur   ? GERD (gastroesophageal reflux disease) 02/17/2021  ? Soft tissue disorder of ankle 12/28/2020  ? Arthralgia of right hand- and left hand 12/28/2020  ? Acute left ankle pain 12/28/2020  ? Seasonal allergies 12/23/2020  ? History of partial hysterectomy 12/16/2019  ? Vaginal dryness, menopausal 12/16/2019  ? Screening for blood or protein in urine 12/16/2019  ? Swelling of joint of hand 12/16/2019  ? Vitamin D deficiency 12/16/2019  ? Leukocytes in urine 12/16/2019  ? ? ? ?Past Medical History:  ?Diagnosis Date  ? COVID-19   ? Fibroids   ? confrimed from medical records  ? GERD (gastroesophageal reflux disease)   ? Heart murmur   ? IBS (irritable bowel syndrome)   ? Leukopenia   ? Sickle cell trait ([place])   ? ? ? ?Past Surgical History:  ?Procedure Laterality Date  ? BREAST BIOPSY Right 01/29/2021  ? Affirm bx-"X" clip-path pending  ? BREAST BIOPSY Right 03/02/2021  ? Sffirm bx- coil clip, path pending   ? BREAST CYST EXCISION Right 50 years ago ~1970  ? neg  ? BREAST SURGERY    ?  LAPAROSCOPIC ASSISTED VAGINAL HYSTERECTOMY  11/18/2009  ? partial hysterectomy only ovaries left no h/o h/o abnormal pap   ? TUBAL LIGATION    ? 1985  ? ? ?Social History  ? ?Socioeconomic History  ? Marital status: Married  ?  Spouse name: Not on file  ? Number of children: Not on file  ? Years of education: Not on file  ? Highest education level: Not on file  ?Occupational History  ? Not on file  ?Tobacco Use  ?  Smoking status: Never  ? Smokeless tobacco: Never  ?Substance and Sexual Activity  ? Alcohol use: Not Currently  ? Drug use: Never  ? Sexual activity: Not Currently  ?Other Topics Concern  ? Not on file  ?Social History Narrative  ? 7 siblings, 4 living as of 02/12/21   ? Retired Producer, television/film/video   ? 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 Grew up [place] moved to [place] 2nd grade relocated here 2020  ? College eduation  ? ?Social Determinants of Health  ? ?Financial Resource Strain: Not on file  ?Food Insecurity: Not on file  ?Transportation Needs: Not on file  ?Physical Activity: Not on file  ?Stress: Not on file  ?Social Connections: Not on file  ?Intimate Partner Violence: Not on file  ? ?  ?Family History  ?Problem Relation Age of Onset  ? Cancer Sister   ?     jaw   ? Heart attack Brother   ? Cancer Brother   ?     GU cancer  ? Hypertension Maternal Grandmother   ? Cancer Maternal Grandfather   ?     ?type  ? Lung cancer Maternal Aunt   ? Lung cancer Maternal Aunt   ? Breast cancer Cousin   ? Uterine cancer Cousin   ? Hypertension Other   ?     siblings   ? ? ? ?Current Outpatient Medications:  ?  Calcium Carbonate-Vitamin D 600-400 MG-UNIT tablet, Take 1 tablet by mouth daily., Disp: , Rfl:  ?  fluticasone (FLONASE) 50 MCG/ACT nasal spray, Place 2 sprays into both nostrils daily., Disp: 18.2 mL, Rfl: 2 ?  Iron, Ferrous Sulfate, 325 (65 Fe) MG TABS, , Disp: , Rfl:  ?  levocetirizine (XYZAL) 5 MG tablet, Take 1 tablet (5 mg total) by mouth every evening., Disp: 90 tablet, Rfl: 1 ?  linaclotide (LINZESS) 145 MCG CAPS capsule, Take 1 capsule (145 mcg total) by mouth daily before breakfast., Disp: 30 capsule, Rfl: 11 ?  Multiple Vitamins-Minerals (CENTRUM SILVER PO), Take by mouth., Disp: , Rfl:  ? ? ?Physical exam: There were no vitals filed for this visit. ?Physical Exam ?Constitutional:   ?   General: She is not in acute distress. ?Cardiovascular:  ?   Rate and Rhythm: Normal rate and regular rhythm.  ?   Heart sounds: Normal  heart sounds.  ?Pulmonary:  ?   Effort: Pulmonary effort is normal.  ?   Breath sounds: Normal breath sounds.  ?Abdominal:  ?   General: Bowel sounds are normal.  ?   Palpations: Abdomen is soft.  ?Lymphadenopathy:  ?   Comments: No palpable cervical, supraclavicular, axillary or inguinal adenopathy ? ?  ?Skin: ?   General: Skin is warm and dry.  ?Neurological:  ?   Mental Status: She is alert and oriented to person, place, and time.  ?  ? ? ? ? ?  Latest Ref Rng & Units 12/23/2021  ?  9:54  AM  ?CMP  ?Glucose 70 - 99 mg/dL 98    ?BUN 6 - 23 mg/dL 9    ?Creatinine 0.40 - 1.20 mg/dL 0.79    ?Sodium 135 - 145 mEq/L 140    ?Potassium 3.5 - 5.1 mEq/L 4.5    ?Chloride 96 - 112 mEq/L 105    ?CO2 19 - 32 mEq/L 29    ?Calcium 8.4 - 10.5 mg/dL 9.3    ?Total Protein 6.0 - 8.3 g/dL 7.4    ?Total Bilirubin 0.2 - 1.2 mg/dL 0.4    ?Alkaline Phos 39 - 117 U/L 64    ?AST 0 - 37 U/L 19    ?ALT 0 - 35 U/L 15    ? ? ?  Latest Ref Rng & Units 12/23/2021  ?  9:54 AM  ?CBC  ?WBC 4.0 - 10.5 K/uL 2.9    ?Hemoglobin 12.0 - 15.0 g/dL 12.4    ?Hematocrit 36.0 - 46.0 % 38.9    ?Platelets 150.0 - 400.0 K/uL 244.0    ? ? ?No images are attached to the encounter. ? ?US Renal ? ?Result Date: 01/13/2022 ?CLINICAL DATA:  Renal cysts EXAM: RENAL / URINARY TRACT ULTRASOUND COMPLETE COMPARISON:  None. FINDINGS: Right Kidney: Renal measurements: 12.4 x 4.5 x 4.9 cm = volume: 142.5 mL. There are cystic structures in the central portion of right kidney. There is no demonstrable dilation of renal pelvis. There is 1.4 cm cyst in the lateral aspect of right kidney. Left Kidney: Renal measurements: 12.8 x 5.3 x 4.6 cm = volume: 160 mL. There is 1.8 x 1.6 cm cyst in the mid to lower portion of left kidney. There is no hydronephrosis. Bladder: Appears normal for degree of bladder distention. Other: None. IMPRESSION: Small cortical cysts are seen in both kidneys. There are multiple cystic structures in the central portion of right kidney without definite dilation of  renal pelvis. This finding may suggest dilation of minor calices or presence of multiple parapelvic cysts Electronically Signed   By: Elmer Picker M.D.   On: 01/13/2022 14:22   ? ?Assessment and p

## 2022-01-26 ENCOUNTER — Ambulatory Visit
Admission: RE | Admit: 2022-01-26 | Discharge: 2022-01-26 | Disposition: A | Discharge status: Home / Self Care | Payer: Medicare Other | Source: Ambulatory Visit | Attending: Internal Medicine | Admitting: Internal Medicine

## 2022-01-26 ENCOUNTER — Other Ambulatory Visit: Payer: Self-pay | Admitting: Internal Medicine

## 2022-01-26 DIAGNOSIS — E2839 Other primary ovarian failure: Secondary | ICD-10-CM | POA: Diagnosis present

## 2022-01-26 DIAGNOSIS — Z1231 Encounter for screening mammogram for malignant neoplasm of breast: Secondary | ICD-10-CM | POA: Insufficient documentation

## 2022-01-26 DIAGNOSIS — R928 Other abnormal and inconclusive findings on diagnostic imaging of breast: Secondary | ICD-10-CM

## 2022-01-26 DIAGNOSIS — R921 Mammographic calcification found on diagnostic imaging of breast: Secondary | ICD-10-CM

## 2022-02-14 ENCOUNTER — Other Ambulatory Visit: Payer: Self-pay | Admitting: Internal Medicine

## 2022-02-14 ENCOUNTER — Encounter: Payer: Self-pay | Admitting: Oncology

## 2022-02-14 ENCOUNTER — Inpatient Hospital Stay: Payer: Medicare Other | Attending: Oncology | Admitting: Oncology

## 2022-02-14 ENCOUNTER — Ambulatory Visit
Admission: RE | Admit: 2022-02-14 | Discharge: 2022-02-14 | Disposition: A | Discharge status: Home / Self Care | Payer: Medicare Other | Source: Ambulatory Visit | Attending: Internal Medicine | Admitting: Internal Medicine

## 2022-02-14 DIAGNOSIS — R928 Other abnormal and inconclusive findings on diagnostic imaging of breast: Secondary | ICD-10-CM | POA: Diagnosis present

## 2022-02-14 DIAGNOSIS — R921 Mammographic calcification found on diagnostic imaging of breast: Secondary | ICD-10-CM | POA: Diagnosis present

## 2022-02-14 DIAGNOSIS — D708 Other neutropenia: Secondary | ICD-10-CM | POA: Diagnosis not present

## 2022-02-14 DIAGNOSIS — Z1231 Encounter for screening mammogram for malignant neoplasm of breast: Secondary | ICD-10-CM

## 2022-02-16 ENCOUNTER — Encounter: Payer: Self-pay | Admitting: Internal Medicine

## 2022-02-16 ENCOUNTER — Ambulatory Visit (INDEPENDENT_AMBULATORY_CARE_PROVIDER_SITE_OTHER): Payer: Medicare Other | Admitting: Internal Medicine

## 2022-02-16 VITALS — BP 110/80 | HR 75 | Temp 98.5°F | Resp 14 | Ht 65.0 in | Wt 151.8 lb

## 2022-02-16 DIAGNOSIS — N281 Cyst of kidney, acquired: Secondary | ICD-10-CM | POA: Diagnosis not present

## 2022-02-16 DIAGNOSIS — K5904 Chronic idiopathic constipation: Secondary | ICD-10-CM

## 2022-02-16 DIAGNOSIS — F5104 Psychophysiologic insomnia: Secondary | ICD-10-CM | POA: Diagnosis not present

## 2022-02-16 DIAGNOSIS — E2839 Other primary ovarian failure: Secondary | ICD-10-CM

## 2022-02-16 HISTORY — DX: Psychophysiologic insomnia: F51.04

## 2022-02-16 MED ORDER — ESZOPICLONE 1 MG PO TABS: 1.0000 mg | ORAL_TABLET | Freq: Every evening | ORAL | 2 refills | Status: DC | PRN

## 2022-02-16 NOTE — Progress Notes (Addendum)
Chief Complaint  ?Patient presents with  ? Follow-up  ?  Disc about ongoing sleep issues and constipation. Has bowel movements 1-2 times daily. Denies any pain.  ? ?F/u  ?1. Chronic insomnia having trouble falling and staying asleep sleeping 5 hrs at night Tylenol PM tried and melatonin but melatonin makes her groggy she even tried alternating the 2  ?2. Constipation tried linzess, metamucil, colace and is having stools  ? ? ?Review of Systems  ?Constitutional:  Negative for weight loss.  ?HENT:  Negative for hearing loss.   ?Eyes:  Negative for blurred vision.  ?Respiratory:  Negative for shortness of breath.   ?Cardiovascular:  Negative for chest pain.  ?Gastrointestinal:  Negative for abdominal pain and blood in stool.  ?Genitourinary:  Negative for dysuria.  ?Musculoskeletal:  Negative for falls and joint pain.  ?Skin:  Negative for rash.  ?Neurological:  Negative for headaches.  ?Psychiatric/Behavioral:  Negative for depression. The patient has insomnia.   ?Past Medical History:  ?Diagnosis Date  ? COVID-19   ? Fibroids   ? confrimed from medical records  ? GERD (gastroesophageal reflux disease)   ? Heart murmur   ? IBS (irritable bowel syndrome)   ? Kidney stone   ? 2004  ? Leukopenia   ? Sickle cell trait ([place])   ? ?Past Surgical History:  ?Procedure Laterality Date  ? BREAST BIOPSY Right 01/29/2021  ? Affirm bx-"X" clip-Benign  ? BREAST BIOPSY Right 03/02/2021  ? Sffirm bx- coil clip, path pending   ? BREAST CYST EXCISION Right 50 years ago ~1970  ? neg  ? BREAST SURGERY    ? LAPAROSCOPIC ASSISTED VAGINAL HYSTERECTOMY  11/18/2009  ? partial hysterectomy only ovaries left no h/o h/o abnormal pap   ? LITHOTRIPSY Right   ? 2004  ? TUBAL LIGATION    ? 1985  ? ?Family History  ?Problem Relation Age of Onset  ? Cancer Sister   ?     jaw   ? Heart attack Brother   ? Cancer Brother   ?     GU cancer  ? Lung cancer Maternal Aunt   ? Lung cancer Maternal Aunt   ? Hypertension Maternal Grandmother   ? Cancer Maternal  Grandfather   ?     ?type  ? Breast cancer Cousin   ? Uterine cancer Cousin   ? Hypertension Other   ?     siblings   ? ?Social History  ? ?Socioeconomic History  ? Marital status: Married  ?  Spouse name: Not on file  ? Number of children: Not on file  ? Years of education: Not on file  ? Highest education level: Not on file  ?Occupational History  ? Not on file  ?Tobacco Use  ? Smoking status: Never  ? Smokeless tobacco: Never  ?Substance and Sexual Activity  ? Alcohol use: Not Currently  ? Drug use: Never  ? Sexual activity: Not Currently  ?Other Topics Concern  ? Not on file  ?Social History Narrative  ? 7 siblings, 4 living as of 02/12/21   ? Retired Producer, television/film/video   ? 2 female kids 47, 44 02/12/21   ? Grew up [place] moved to [place] 2nd grade relocated here 2020  ? College eduation  ? ?Social Determinants of Health  ? ?Financial Resource Strain: Not on file  ?Food Insecurity: Not on file  ?Transportation Needs: Not on file  ?Physical Activity: Not on file  ?Stress: Not on file  ?Social  Connections: Not on file  ?Intimate Partner Violence: Not on file  ? ?Current Meds  ?Medication Sig  ? Calcium Carbonate-Vitamin D 600-400 MG-UNIT tablet Take 1 tablet by mouth daily.  ? eszopiclone (LUNESTA) 1 MG TABS tablet Take 1 tablet (1 mg total) by mouth at bedtime as needed for sleep. Take immediately before bedtime  ? fluticasone (FLONASE) 50 MCG/ACT nasal spray Place 2 sprays into both nostrils daily.  ? Iron, Ferrous Sulfate, 325 (65 Fe) MG TABS Take 1 tablet by mouth daily.  ? levocetirizine (XYZAL) 5 MG tablet Take 1 tablet (5 mg total) by mouth every evening.  ? linaclotide (LINZESS) 145 MCG CAPS capsule Take 1 capsule (145 mcg total) by mouth daily before breakfast.  ? Multiple Vitamins-Minerals (CENTRUM SILVER PO) Take 1 tablet by mouth daily.  ? ?Allergies  ?Allergen Reactions  ? Sulfa Antibiotics Swelling  ?  Facial Swelling  ? Demerol [Meperidine Hcl] Rash  ? ?Recent Results (from the past 2160 hour(s))  ?Urinalysis,  Routine w reflex microscopic     Status: Abnormal  ? Collection Time: 12/23/21  9:54 AM  ?Result Value Ref Range  ? Specific Gravity, UA 1.015 1.005 - 1.030  ? pH, UA 6.5 5.0 - 7.5  ? Color, UA Yellow Yellow  ? Appearance Ur Clear Clear  ? Leukocytes,UA 1+ (A) Negative  ? Protein,UA Negative Negative/Trace  ? Glucose, UA Negative Negative  ? Ketones, UA Negative Negative  ? RBC, UA Negative Negative  ? Bilirubin, UA Negative Negative  ? Urobilinogen, Ur 0.2 0.2 - 1.0 mg/dL  ? Nitrite, UA Negative Negative  ? Microscopic Examination See below:   ?  Comment: Microscopic was indicated and was performed.  ?Vitamin D (25 hydroxy)     Status: None  ? Collection Time: 12/23/21  9:54 AM  ?Result Value Ref Range  ? VITD 31.90 30.00 - 100.00 ng/mL  ?TSH     Status: None  ? Collection Time: 12/23/21  9:54 AM  ?Result Value Ref Range  ? TSH 3.49 0.35 - 5.50 uIU/mL  ?CBC with Differential/Platelet     Status: Abnormal  ? Collection Time: 12/23/21  9:54 AM  ?Result Value Ref Range  ? WBC 2.9 (L) 4.0 - 10.5 K/uL  ? RBC 5.25 (H) 3.87 - 5.11 Mil/uL  ? Hemoglobin 12.4 12.0 - 15.0 g/dL  ? HCT 38.9 36.0 - 46.0 %  ? MCV 74.1 (L) 78.0 - 100.0 fl  ? MCHC 31.9 30.0 - 36.0 g/dL  ? RDW 15.5 11.5 - 15.5 %  ? Platelets 244.0 150.0 - 400.0 K/uL  ? Neutrophils Relative % 29.9 (L) 43.0 - 77.0 %  ? Lymphocytes Relative 52.3 (H) 12.0 - 46.0 %  ? Monocytes Relative 9.2 3.0 - 12.0 %  ? Eosinophils Relative 7.7 (H) 0.0 - 5.0 %  ? Basophils Relative 0.9 0.0 - 3.0 %  ? Neutro Abs 0.9 (L) 1.4 - 7.7 K/uL  ? Lymphs Abs 1.5 0.7 - 4.0 K/uL  ? Monocytes Absolute 0.3 0.1 - 1.0 K/uL  ? Eosinophils Absolute 0.2 0.0 - 0.7 K/uL  ? Basophils Absolute 0.0 0.0 - 0.1 K/uL  ?Lipid panel     Status: None  ? Collection Time: 12/23/21  9:54 AM  ?Result Value Ref Range  ? Cholesterol 149 0 - 200 mg/dL  ?  Comment: ATP III Classification       Desirable:  < 200 mg/dL               Borderline  High:  200 - 239 mg/dL          High:  > = 240 mg/dL  ? Triglycerides 56.0 0.0 -  149.0 mg/dL  ?  Comment: Normal:  <150 mg/dLBorderline High:  150 - 199 mg/dL  ? HDL 58.80 >39.00 mg/dL  ? VLDL 11.2 0.0 - 40.0 mg/dL  ? LDL Cholesterol 79 0 - 99 mg/dL  ? Total CHOL/HDL Ratio 3   ?  Comment:                Men          Women1/2 Average Risk     3.4          3.3Average Risk          5.0          4.42X Average Risk          9.6          7.13X Average Risk          15.0          11.0                      ? NonHDL 89.96   ?  Comment: NOTE:  Non-HDL goal should be 30 mg/dL higher than patient's LDL goal (i.e. LDL goal of < 70 mg/dL, would have non-HDL goal of < 100 mg/dL)  ?Comprehensive metabolic panel     Status: None  ? Collection Time: 12/23/21  9:54 AM  ?Result Value Ref Range  ? Sodium 140 135 - 145 mEq/L  ? Potassium 4.5 3.5 - 5.1 mEq/L  ? Chloride 105 96 - 112 mEq/L  ? CO2 29 19 - 32 mEq/L  ? Glucose, Bld 98 70 - 99 mg/dL  ? BUN 9 6 - 23 mg/dL  ? Creatinine, Ser 0.79 0.40 - 1.20 mg/dL  ? Total Bilirubin 0.4 0.2 - 1.2 mg/dL  ? Alkaline Phosphatase 64 39 - 117 U/L  ? AST 19 0 - 37 U/L  ? ALT 15 0 - 35 U/L  ? Total Protein 7.4 6.0 - 8.3 g/dL  ? Albumin 4.5 3.5 - 5.2 g/dL  ? GFR 78.59 >60.00 mL/min  ?  Comment: Calculated using the CKD-EPI Creatinine Equation (2021)  ? Calcium 9.3 8.4 - 10.5 mg/dL  ?Microscopic Examination     Status: Abnormal  ? Collection Time: 12/23/21  9:54 AM  ? Urine  ?Result Value Ref Range  ? WBC, UA 6-10 (A) 0 - 5 /hpf  ? RBC 0-2 0 - 2 /hpf  ? Epithelial Cells (non renal) 0-10 0 - 10 /hpf  ? Casts None seen None seen /lpf  ? Bacteria, UA None seen None seen/Few  ?Urine Culture     Status: None  ? Collection Time: 12/31/21  2:33 PM  ? Specimen: Urine  ?Result Value Ref Range  ? MICRO NUMBER: [id number]   ? SPECIMEN QUALITY: Adequate   ? Sample Source NOT GIVEN   ? STATUS: FINAL   ? Result:    ?  Mixed genital flora isolated. These superficial bacteria are not indicative of a urinary tract infection. No further organism identification is warranted on this specimen. If  clinically indicated, recollect clean-catch, mid-stream urine and transfer  ?immediately to Urine Culture Transport Tube. ?  ?Iron and TIBC     Status: Abnormal  ? Collection Time: 01/24/22  2:53 PM  ?Result Val

## 2022-02-16 NOTE — Patient Instructions (Addendum)
Warm prune juice  ?Kiwis  ?Warm ginger tea  ?Water 55-64 ounces daily  ?Pre-probiotics Align over the counter  ? ? ? ?Insomnia ?Insomnia is a sleep disorder that makes it difficult to fall asleep or stay asleep. Insomnia can cause fatigue, low energy, difficulty concentrating, mood swings, and poor performance at work or school. ?There are three different ways to classify insomnia: ?Difficulty falling asleep. ?Difficulty staying asleep. ?Waking up too early in the morning. ?Any type of insomnia can be long-term (chronic) or short-term (acute). Both are common. Short-term insomnia usually lasts for 3 months or less. Chronic insomnia occurs at least three times a week for longer than 3 months. ?What are the causes? ?Insomnia may be caused by another condition, situation, or substance, such as: ?Having certain mental health conditions, such as anxiety and depression. ?Using caffeine, alcohol, tobacco, or drugs. ?Having gastrointestinal conditions, such as gastroesophageal reflux disease (GERD). ?Having certain medical conditions. These include: ?Asthma. ?Alzheimer's disease. ?Stroke. ?Chronic pain. ?An overactive thyroid gland (hyperthyroidism). ?Other sleep disorders, such as restless legs syndrome and sleep apnea. ?Menopause. ?Sometimes, the cause of insomnia may not be known. ?What increases the risk? ?Risk factors for insomnia include: ?Gender. Females are affected more often than males. ?Age. Insomnia is more common as people get older. ?Stress and certain medical and mental health conditions. ?Lack of exercise. ?Having an irregular work schedule. This may include working night shifts and traveling between different time zones. ?What are the signs or symptoms? ?If you have insomnia, the main symptom is having trouble falling asleep or having trouble staying asleep. This may lead to other symptoms, such as: ?Feeling tired or having low energy. ?Feeling nervous about going to sleep. ?Not feeling rested in the  morning. ?Having trouble concentrating. ?Feeling irritable, anxious, or depressed. ?How is this diagnosed? ?This condition may be diagnosed based on: ?Your symptoms and medical history. Your health care provider may ask about: ?Your sleep habits. ?Any medical conditions you have. ?Your mental health. ?A physical exam. ?How is this treated? ?Treatment for insomnia depends on the cause. Treatment may focus on treating an underlying condition that is causing the insomnia. Treatment may also include: ?Medicines to help you sleep. ?Counseling or therapy. ?Lifestyle adjustments to help you sleep better. ?Follow these instructions at home: ?Eating and drinking ? ?Limit or avoid alcohol, caffeinated beverages, and products that contain nicotine and tobacco, especially close to bedtime. These can disrupt your sleep. ?Do not eat a large meal or eat spicy foods right before bedtime. This can lead to digestive discomfort that can make it hard for you to sleep. ?Sleep habits ? ?Keep a sleep diary to help you and your health care provider figure out what could be causing your insomnia. Write down: ?When you sleep. ?When you wake up during the night. ?How well you sleep and how rested you feel the next day. ?Any side effects of medicines you are taking. ?What you eat and drink. ?Make your bedroom a dark, comfortable place where it is easy to fall asleep. ?Put up shades or blackout curtains to block light from outside. ?Use a white noise machine to block noise. ?Keep the temperature cool. ?Limit screen use before bedtime. This includes: ?Not watching TV. ?Not using your smartphone, tablet, or computer. ?Stick to a routine that includes going to bed and waking up at the same times every day and night. This can help you fall asleep faster. Consider making a quiet activity, such as reading, part of your nighttime routine. ?Try  to avoid taking naps during the day so that you sleep better at night. ?Get out of bed if you are still awake  after 15 minutes of trying to sleep. Keep the lights down, but try reading or doing a quiet activity. When you feel sleepy, go back to bed. ?General instructions ?Take over-the-counter and prescription medicines only as told by your health care provider. ?Exercise regularly as told by your health care provider. However, avoid exercising in the hours right before bedtime. ?Use relaxation techniques to manage stress. Ask your health care provider to suggest some techniques that may work well for you. These may include: ?Breathing exercises. ?Routines to release muscle tension. ?Visualizing peaceful scenes. ?Make sure that you drive carefully. Do not drive if you feel very sleepy. ?Keep all follow-up visits. This is important. ?Contact a health care provider if: ?You are tired throughout the day. ?You have trouble in your daily routine due to sleepiness. ?You continue to have sleep problems, or your sleep problems get worse. ?Get help right away if: ?You have thoughts about hurting yourself or someone else. ?Get help right away if you feel like you may hurt yourself or others, or have thoughts about taking your own life. Go to your nearest emergency room or: ?Call 911. ?Call the [place] at [phone] or [phone]. This is open 24 hours a day. ?Text the Crisis Text Line at [phone]. ?Summary ?Insomnia is a sleep disorder that makes it difficult to fall asleep or stay asleep. ?Insomnia can be long-term (chronic) or short-term (acute). ?Treatment for insomnia depends on the cause. Treatment may focus on treating an underlying condition that is causing the insomnia. ?Keep a sleep diary to help you and your health care provider figure out what could be causing your insomnia. ?This information is not intended to replace advice given to you by your health care provider. Make sure you discuss any questions you have with your health care provider. ?Document Revised: 09/06/2021 Document Reviewed:  09/06/2021 ?Elsevier Patient Education ? [place]. ? ?Eszopiclone Tablets ?What is this medication? ?ESZOPICLONE (es ZOE pi clone) treats insomnia. It helps you go to sleep faster and stay asleep through the night. It is often used for a short period of time. ?This medicine may be used for other purposes; ask your health care provider or pharmacist if you have questions. ?COMMON BRAND NAME(S): Lunesta ?What should I tell my care team before I take this medication? ?They need to know if you have any of these conditions: ?Depression ?Liver disease ?Lung or breathing disease, such as asthma or COPD ?Substance use disorder ?Sleep-walking, driving, eating or other activity while not fully awake after taking a sleep medication ?Suicidal thoughts, plans, or attempt by you or a family member ?An unusual or allergic reaction to eszopiclone, other medications, foods, dyes, or preservatives ?Pregnant or trying to get pregnant ?Breast-feeding ?How should I use this medication? ?Take this medication by mouth with a glass of water. Follow the directions on the prescription label. It is better to take this medication on an empty stomach and only when you are ready for bed. Do not take your medication more often than directed. If you have been taking this medication for several weeks and suddenly stop taking it, you may get unpleasant withdrawal symptoms. Your care team may want to gradually reduce the dose. Do not stop taking this medication on your own. Always follow your care team's advice. ?A special MedGuide will be given to you by the  pharmacist with each prescription and refill. Be sure to read this information carefully each time. ?Talk to your care team about the use of this medication in children. Special care may be needed. ?Overdosage: If you think you have taken too much of this medicine contact a poison control center or emergency room at once. ?NOTE: This medicine is only for you. Do not share this medicine  with others. ?What if I miss a dose? ?This does not apply. This medication should only be taken immediately before going to sleep. Do not take double or extra doses. ?What may interact with this medication? ?Herb

## 2022-02-17 NOTE — Progress Notes (Signed)
I connected with Kim Mitchell on 02/17/22 at 10:15 AM EDT by video enabled telemedicine visit and verified that I am speaking with the correct person using two identifiers. ?  ?I discussed the limitations, risks, security and privacy concerns of performing an evaluation and management service by telemedicine and the availability of in-person appointments. I also discussed with the patient that there may be a patient responsible charge related to this service. The patient expressed understanding and agreed to proceed. ? ?Other persons participating in the visit and their role in the encounter:  none ? ?Patient's location:  home ?Provider's location:  work ? ?Chief Complaint:  Discuss results of blood work ? ?History of present illness:  patient is a 66 year old female with a past medical history of GERD, irritable bowel syndrome who has been referred to [place] for leukopenia.  Her most recent CBC from 12/23/2021 showed a white count of 2.9, H&H of 12.4/38.9 with an MCV of 74 and a platelet count of 244.  ANC was low at 0.9.  Looking back at her prior CBCs her white count was 4 with a ANC of 1.5 in March 2022 and a white count of 3.4 with an Traer of 1.1 in March 2021.  Prior hepatitis testing, HIV testing, rheumatoid factor and autoimmune work-up has been negative ?  ?Results of blood work from 01/24/2022 showed white cell count of 3.5, H&H of 12.9/40.1 with an MCV of 73And a platelet count of 237.  Ferritin B12 folate normal.  Smear review unremarkable. ? ?Interval history patient is currently doing well and denies any specific complaints at this time other than mild fatigue. ? ? ?Review of Systems  ?Constitutional:  Positive for malaise/fatigue. Negative for chills, fever and weight loss.  ?HENT:  Negative for congestion, ear discharge and nosebleeds.   ?Eyes:  Negative for blurred vision.  ?Respiratory:  Negative for cough, hemoptysis, sputum production, shortness of breath and wheezing.   ?Cardiovascular:  Negative  for chest pain, palpitations, orthopnea and claudication.  ?Gastrointestinal:  Negative for abdominal pain, blood in stool, constipation, diarrhea, heartburn, melena, nausea and vomiting.  ?Genitourinary:  Negative for dysuria, flank pain, frequency, hematuria and urgency.  ?Musculoskeletal:  Negative for back pain, joint pain and myalgias.  ?Skin:  Negative for rash.  ?Neurological:  Negative for dizziness, tingling, focal weakness, seizures, weakness and headaches.  ?Endo/Heme/Allergies:  Does not bruise/bleed easily.  ?Psychiatric/Behavioral:  Negative for depression and suicidal ideas. The patient does not have insomnia.   ? ?Allergies  ?Allergen Reactions  ? Sulfa Antibiotics Swelling  ?  Facial Swelling  ? Demerol [Meperidine Hcl] Rash  ? ? ?Past Medical History:  ?Diagnosis Date  ? COVID-19   ? Fibroids   ? confrimed from medical records  ? GERD (gastroesophageal reflux disease)   ? Heart murmur   ? IBS (irritable bowel syndrome)   ? Kidney stone   ? 2004  ? Leukopenia   ? Sickle cell trait ([place])   ? ? ?Past Surgical History:  ?Procedure Laterality Date  ? BREAST BIOPSY Right 01/29/2021  ? Affirm bx-"X" clip-Benign  ? BREAST BIOPSY Right 03/02/2021  ? Sffirm bx- coil clip, path pending   ? BREAST CYST EXCISION Right 50 years ago ~1970  ? neg  ? BREAST SURGERY    ? LAPAROSCOPIC ASSISTED VAGINAL HYSTERECTOMY  11/18/2009  ? partial hysterectomy only ovaries left no h/o h/o abnormal pap   ? LITHOTRIPSY Right   ? 2004  ? TUBAL LIGATION    ?  1985  ? ? ?Social History  ? ?Socioeconomic History  ? Marital status: Married  ?  Spouse name: Not on file  ? Number of children: Not on file  ? Years of education: Not on file  ? Highest education level: Not on file  ?Occupational History  ? Not on file  ?Tobacco Use  ? Smoking status: Never  ? Smokeless tobacco: Never  ?Substance and Sexual Activity  ? Alcohol use: Not Currently  ? Drug use: Never  ? Sexual activity: Not Currently  ?Other Topics Concern  ? Not on file   ?Social History Narrative  ? 7 siblings, 4 living as of 02/12/21   ? Retired Producer, television/film/video   ? 2 female kids 8, 53 02/12/21   ? [place] moved to [place] 2nd grade relocated here 2020  ? College eduation  ? ?Social Determinants of Health  ? ?Financial Resource Strain: Not on file  ?Food Insecurity: Not on file  ?Transportation Needs: Not on file  ?Physical Activity: Not on file  ?Stress: Not on file  ?Social Connections: Not on file  ?Intimate Partner Violence: Not on file  ? ? ?Family History  ?Problem Relation Age of Onset  ? Cancer Sister   ?     jaw   ? Heart attack Brother   ? Cancer Brother   ?     GU cancer  ? Lung cancer Maternal Aunt   ? Lung cancer Maternal Aunt   ? Hypertension Maternal Grandmother   ? Cancer Maternal Grandfather   ?     ?type  ? Breast cancer Cousin   ? Uterine cancer Cousin   ? Hypertension Other   ?     siblings   ? ? ? ?Current Outpatient Medications:  ?  Calcium Carbonate-Vitamin D 600-400 MG-UNIT tablet, Take 1 tablet by mouth daily., Disp: , Rfl:  ?  fluticasone (FLONASE) 50 MCG/ACT nasal spray, Place 2 sprays into both nostrils daily., Disp: 18.2 mL, Rfl: 2 ?  Iron, Ferrous Sulfate, 325 (65 Fe) MG TABS, Take 1 tablet by mouth daily., Disp: , Rfl:  ?  levocetirizine (XYZAL) 5 MG tablet, Take 1 tablet (5 mg total) by mouth every evening., Disp: 90 tablet, Rfl: 1 ?  linaclotide (LINZESS) 145 MCG CAPS capsule, Take 1 capsule (145 mcg total) by mouth daily before breakfast., Disp: 30 capsule, Rfl: 11 ?  Multiple Vitamins-Minerals (CENTRUM SILVER PO), Take 1 tablet by mouth daily., Disp: , Rfl:  ?  eszopiclone (LUNESTA) 1 MG TABS tablet, Take 1 tablet (1 mg total) by mouth at bedtime as needed for sleep. Take immediately before bedtime, Disp: 30 tablet, Rfl: 2 ? ?DG Bone Density ? ?Result Date: 01/26/2022 ?EXAM: DUAL X-RAY ABSORPTIOMETRY (DXA) FOR BONE MINERAL DENSITY IMPRESSION: Your patient Kim Mitchell completed a BMD test on 01/26/2022 using the [employer] (software  version: 14.10) manufactured by [employer]. The following summarizes the results of our evaluation. Technologist: SCE PATIENT BIOGRAPHICAL: Name: Kim, Mitchell Patient ID: [id number] Birth Date: [birth year] Height: 65.5 in. Gender: Female Exam Date: 01/26/2022 Weight: 149.5 lbs. Indications: Hysterectomy, Postmenopausal Fractures: Treatments: Multi-Vitamin, Vitamin D DENSITOMETRY RESULTS: Site         Region     Measured Date Measured Age WHO Classification Young Adult T-score BMD         %Change vs. Previous Significant Change (*) AP Spine L1-L2 01/26/2022 65.3 Normal -0.8 1.079 g/cm2 - - DualFemur Total Left 01/26/2022 65.3 Normal -  0.3 0.969 g/cm2 - - DualFemur Total Mean 01/26/2022 65.3 Normal -0.1 0.992 g/cm2 - - Left Forearm Radius 33% 01/26/2022 65.3 Osteopenia -1.7 0.729 g/cm2 - - ASSESSMENT: The BMD measured at Forearm Radius 33% is 0.729 g/cm2 with a T-score of -1.7. This patient is considered osteopenic according to [facility]) criteria. The scan quality is good. L3 and L4 was excluded due to degenerative changes. World Pharmacologist [facility]) criteria for post-menopausal, Caucasian Women: Normal:                   T-score at or above -1 SD Osteopenia/low bone mass: T-score between -1 and -2.5 SD Osteoporosis:             T-score at or below -2.5 SD RECOMMENDATIONS: 1. All patients should optimize calcium and vitamin D intake. 2. Consider FDA-approved medical therapies in postmenopausal women and men aged 48 years and older, based on the following: a. A hip or vertebral(clinical or morphometric) fracture b. T-score < -2.5 at the femoral neck or spine after appropriate evaluation to exclude secondary causes c. Low bone mass (T-score between -1.0 and -2.5 at the femoral neck or spine) and a 10-year probability of a hip fracture > 3% or a 10-year probability of a major osteoporosis-related fracture > 20% based on the [place]-adapted WHO algorithm 3. Clinician judgment and/or  patient preferences may indicate treatment for people with 10-year fracture probabilities above or below these levels FOLLOW-UP: People with diagnosed cases of osteoporosis or at high risk for fracture sho

## 2022-02-23 ENCOUNTER — Encounter: Payer: Self-pay | Admitting: Internal Medicine

## 2022-03-16 ENCOUNTER — Encounter: Payer: Self-pay | Admitting: Internal Medicine

## 2022-03-16 ENCOUNTER — Other Ambulatory Visit: Payer: Self-pay | Admitting: Internal Medicine

## 2022-03-16 DIAGNOSIS — K581 Irritable bowel syndrome with constipation: Secondary | ICD-10-CM

## 2022-03-16 DIAGNOSIS — J302 Other seasonal allergic rhinitis: Secondary | ICD-10-CM

## 2022-03-16 DIAGNOSIS — K5900 Constipation, unspecified: Secondary | ICD-10-CM

## 2022-03-16 MED ORDER — LINACLOTIDE 145 MCG PO CAPS: 145.0000 ug | ORAL_CAPSULE | Freq: Every day | ORAL | 11 refills | Status: DC

## 2022-03-16 MED ORDER — FLUTICASONE PROPIONATE 50 MCG/ACT NA SUSP: 2.0000 | Freq: Every day | NASAL | 11 refills | Status: DC

## 2022-03-16 MED ORDER — LEVOCETIRIZINE DIHYDROCHLORIDE 5 MG PO TABS: 5.0000 mg | ORAL_TABLET | Freq: Every evening | ORAL | 3 refills | Status: DC

## 2022-08-08 ENCOUNTER — Encounter (INDEPENDENT_AMBULATORY_CARE_PROVIDER_SITE_OTHER): Payer: Self-pay

## 2022-10-06 ENCOUNTER — Other Ambulatory Visit: Payer: Self-pay | Admitting: *Deleted

## 2022-10-06 DIAGNOSIS — M2550 Pain in unspecified joint: Secondary | ICD-10-CM

## 2022-10-06 DIAGNOSIS — Z13818 Encounter for screening for other digestive system disorders: Secondary | ICD-10-CM

## 2022-10-06 DIAGNOSIS — D72819 Decreased white blood cell count, unspecified: Secondary | ICD-10-CM

## 2022-10-06 DIAGNOSIS — D708 Other neutropenia: Secondary | ICD-10-CM

## 2022-10-12 ENCOUNTER — Other Ambulatory Visit (INDEPENDENT_AMBULATORY_CARE_PROVIDER_SITE_OTHER): Payer: Medicare Other

## 2022-10-12 DIAGNOSIS — D708 Other neutropenia: Secondary | ICD-10-CM | POA: Diagnosis not present

## 2022-10-12 DIAGNOSIS — Z13818 Encounter for screening for other digestive system disorders: Secondary | ICD-10-CM

## 2022-10-12 DIAGNOSIS — D72819 Decreased white blood cell count, unspecified: Secondary | ICD-10-CM

## 2022-10-12 DIAGNOSIS — M2550 Pain in unspecified joint: Secondary | ICD-10-CM

## 2022-10-12 LAB — COMPREHENSIVE METABOLIC PANEL
ALT: 13 U/L (ref 0–35)
AST: 18 U/L (ref 0–37)
Albumin: 4.3 g/dL (ref 3.5–5.2)
Alkaline Phosphatase: 51 U/L (ref 39–117)
BUN: 12 mg/dL (ref 6–23)
CO2: 29 mEq/L (ref 19–32)
Calcium: 9.2 mg/dL (ref 8.4–10.5)
Chloride: 105 mEq/L (ref 96–112)
Creatinine, Ser: 0.75 mg/dL (ref 0.40–1.20)
GFR: 83.17 mL/min (ref >60.00)
Glucose, Bld: 87 mg/dL (ref 70–99)
Potassium: 4.5 mEq/L (ref 3.5–5.1)
Sodium: 141 mEq/L (ref 135–145)
Total Bilirubin: 0.4 mg/dL (ref 0.2–1.2)
Total Protein: 7 g/dL (ref 6.0–8.3)

## 2022-10-12 LAB — CBC WITH DIFFERENTIAL/PLATELET
Basophils Absolute: 0 10*3/uL (ref 0.0–0.1)
Basophils Relative: 0.8 % (ref 0.0–3.0)
Eosinophils Absolute: 0.3 10*3/uL (ref 0.0–0.7)
Eosinophils Relative: 8.9 % — ABNORMAL HIGH (ref 0.0–5.0)
HCT: 37.9 % (ref 36.0–46.0)
Hemoglobin: 12.2 g/dL (ref 12.0–15.0)
Lymphocytes Relative: 51.8 % — ABNORMAL HIGH (ref 12.0–46.0)
Lymphs Abs: 1.8 10*3/uL (ref 0.7–4.0)
MCHC: 32 g/dL (ref 30.0–36.0)
MCV: 74.6 fl — ABNORMAL LOW (ref 78.0–100.0)
Monocytes Absolute: 0.4 10*3/uL (ref 0.1–1.0)
Monocytes Relative: 11.8 % (ref 3.0–12.0)
Neutro Abs: 0.9 10*3/uL — ABNORMAL LOW (ref 1.4–7.7)
Neutrophils Relative %: 26.7 % — ABNORMAL LOW (ref 43.0–77.0)
Platelets: 251 10*3/uL (ref 150.0–400.0)
RBC: 5.09 Mil/uL (ref 3.87–5.11)
RDW: 15.6 % — ABNORMAL HIGH (ref 11.5–15.5)
WBC: 3.5 10*3/uL — ABNORMAL LOW (ref 4.0–10.5)

## 2022-10-12 LAB — IBC + FERRITIN
Ferritin: 149 ng/mL (ref 10.0–291.0)
Iron: 98 ug/dL (ref 42–145)
Saturation Ratios: 35.7 % (ref 20.0–50.0)
TIBC: 274.4 ug/dL (ref 250.0–450.0)
Transferrin: 196 mg/dL — ABNORMAL LOW (ref 212.0–360.0)

## 2022-10-12 LAB — LIPID PANEL
Cholesterol: 169 mg/dL (ref 0–200)
HDL: 55 mg/dL (ref >39.00)
LDL Cholesterol: 102 mg/dL — ABNORMAL HIGH (ref 0–99)
NonHDL: 113.97
Total CHOL/HDL Ratio: 3
Triglycerides: 62 mg/dL (ref 0.0–149.0)
VLDL: 12.4 mg/dL (ref 0.0–40.0)

## 2022-10-19 ENCOUNTER — Encounter: Payer: Self-pay | Admitting: Family Medicine

## 2022-10-19 ENCOUNTER — Ambulatory Visit (INDEPENDENT_AMBULATORY_CARE_PROVIDER_SITE_OTHER): Payer: Medicare Other | Admitting: Family Medicine

## 2022-10-19 ENCOUNTER — Ambulatory Visit (INDEPENDENT_AMBULATORY_CARE_PROVIDER_SITE_OTHER): Payer: Medicare Other

## 2022-10-19 ENCOUNTER — Ambulatory Visit: Payer: Medicare Other | Admitting: Internal Medicine

## 2022-10-19 VITALS — BP 118/72 | HR 74 | Temp 98.8°F | Ht 65.0 in | Wt 154.0 lb

## 2022-10-19 DIAGNOSIS — K219 Gastro-esophageal reflux disease without esophagitis: Secondary | ICD-10-CM

## 2022-10-19 DIAGNOSIS — M7989 Other specified soft tissue disorders: Secondary | ICD-10-CM | POA: Diagnosis not present

## 2022-10-19 DIAGNOSIS — Z23 Encounter for immunization: Secondary | ICD-10-CM

## 2022-10-19 DIAGNOSIS — R011 Cardiac murmur, unspecified: Secondary | ICD-10-CM

## 2022-10-19 DIAGNOSIS — D708 Other neutropenia: Secondary | ICD-10-CM | POA: Diagnosis not present

## 2022-10-19 DIAGNOSIS — M79661 Pain in right lower leg: Secondary | ICD-10-CM | POA: Diagnosis not present

## 2022-10-19 DIAGNOSIS — E559 Vitamin D deficiency, unspecified: Secondary | ICD-10-CM

## 2022-10-19 MED ORDER — APIXABAN 5 MG PO TABS: ORAL_TABLET | ORAL | 0 refills | Status: DC

## 2022-10-19 NOTE — Progress Notes (Addendum)
SUBJECTIVE:   Chief Complaint  Patient presents with   Establish Care    Transfer of Care   HPI Patient presents to clinic to transfer care.  No acute concerns today.  Neutropenic Baseline 2.5-4 x 2 to 3 years.  Currently asymptomatic.  Hemoglobin and platelets within normal limits.  Follows with oncology.  Given chronic stability no recommendation for bone marrow biopsy at this time.  Right ankle swelling Patient reports frank ankle swelling for about a month.  Denies any fevers, decreased range of motion, trauma, repetitive motion, changes in color or skin changes.  No recent travel.  Endorses pain across metatarsals initially that travels upwards across medial malleolus and medial inner  lower leg.  Able to weight-bear.  No changes in foot wear.  Denies any chest pain, shortness of breath or palpitations.  PERTINENT PMH / PSH: Chronic neutropenia Chronic constipation.  OBJECTIVE:  BP 118/72   Pulse 74   Temp 98.8 F (37.1 C)   Ht '5\' 5"'$  (1.651 m)   Wt 154 lb (69.9 kg)   SpO2 99%   BMI 25.63 kg/m    Physical Exam Constitutional:      General: She is not in acute distress.    Appearance: She is normal weight. She is not ill-appearing.  HENT:     Head: Normocephalic.  Eyes:     Conjunctiva/sclera: Conjunctivae normal.  Neck:     Thyroid: No thyromegaly or thyroid tenderness.  Cardiovascular:     Rate and Rhythm: Normal rate and regular rhythm.     Pulses: Normal pulses.  Pulmonary:     Effort: Pulmonary effort is normal.     Breath sounds: Normal breath sounds.  Abdominal:     General: Bowel sounds are normal.  Musculoskeletal:        General: Swelling present.     Cervical back: Normal range of motion.     Right lower leg: Edema present.     Left lower leg: No edema.  Lymphadenopathy:     Cervical: No cervical adenopathy.  Neurological:     Mental Status: She is alert. Mental status is at baseline.  Psychiatric:        Mood and Affect: Mood normal.         Behavior: Behavior normal.        Thought Content: Thought content normal.        Judgment: Judgment normal.     ASSESSMENT/PLAN:  Pain and swelling of lower leg, right Assessment & Plan: Unclear etiology.  Considered DVT, inflammatory process, iron deficiency, cellulitis, gout, cardiac etiology in differentials.  Doubt cellulitis given no fevers or erythema.  Doubt cardiac etiology given no shortness of breath, tachycardia.  No significant change in recent CBC and CMET Will obtain x-rays of ankle and foot Ultrasound to rule out DVT Start Eliquis 10 mg twice daily x 7 days then 5 mg twice daily.  If ultrasound negative can discontinue Eliquis. Check uric acid, ESR, CRP Elevate legs Consider compression stockings Follow-up results. Strict return precautions provided  Orders: -     VAS [place] LOWER EXTREMITY VENOUS (DVT); Future -     C-reactive protein -     Sedimentation rate -     Uric acid -     DG Ankle Complete Right; Future -     DG Foot Complete Right; Future  Other neutropenia (HCC) Assessment & Plan: Chronic.  Stable. Currently asymptomatic.  No recommendation for bone marrow biopsy at this time. Follows  with oncology every 6 months.   Encounter for administration of vaccine -     Flu Vaccine QUAD High Dose(Fluad) -     Pneumococcal conjugate vaccine 20-valent  HCM Colonoscopy up-to-date.  Due 2027-2030 Mammogram up-to-date DEXA up-to-date Recommend shingles vaccine. PCV 20 vaccine today.   PDMP reviewed  Return in about 4 weeks (around 11/16/2022).  Carollee Leitz, MD

## 2022-10-19 NOTE — Patient Instructions (Signed)
It was a pleasure meeting you today. Thank you for allowing me to take part in your health care.  Our goals for today as we discussed include:  Start Eliquis 10 mg two times a day Will get an ultrasound of your leg If negative will let you know if needing to stop medication  Will get lab work today to check for inflammation and gout Will get xray of right foot and ankle  You have received the Pneumonia 20 vaccine.  You do not need any further vaccines  You have received the annual Flu vaccine  Recommend Shingles vaccine.  This is a 2 dose series and can be given at your local pharmacy.  Please talk to your pharmacist about this.   Recommend RSV vaccine.  Please talk to your pharmacist about this.    If you have any questions or concerns, please do not hesitate to call the office at [phone].  I look forward to our next visit and until then take care and stay safe.  Regards,   Carollee Leitz, MD   [facility]

## 2022-10-20 ENCOUNTER — Telehealth: Payer: Self-pay

## 2022-10-20 LAB — C-REACTIVE PROTEIN: CRP: <1 mg/dL (ref 0.5–20.0)

## 2022-10-20 LAB — SEDIMENTATION RATE: Sed Rate: 16 mm/hr (ref 0–30)

## 2022-10-20 LAB — URIC ACID: Uric Acid, Serum: 2.7 mg/dL (ref 2.4–7.0)

## 2022-10-20 NOTE — Telephone Encounter (Signed)
Medication Samples have been provided to the patient.  Drug name: Eliquis        Strength: 5 mg        Qty: 14 tablets  LOT: [id number]  Exp.Date: 01/09/2024  Dosing instructions: Take 10 mg (2 tablets) daily for 7 days  The patient has been instructed regarding the correct time, dose, and frequency of taking this medication, including desired effects and most common side effects.   Suzanna Obey 8:07 AM 10/20/2022

## 2022-10-22 ENCOUNTER — Encounter: Payer: Self-pay | Admitting: Family Medicine

## 2022-10-26 ENCOUNTER — Ambulatory Visit (INDEPENDENT_AMBULATORY_CARE_PROVIDER_SITE_OTHER): Payer: Medicare Other

## 2022-10-26 DIAGNOSIS — M7989 Other specified soft tissue disorders: Secondary | ICD-10-CM

## 2022-10-26 DIAGNOSIS — M79661 Pain in right lower leg: Secondary | ICD-10-CM

## 2022-10-27 ENCOUNTER — Encounter: Payer: Self-pay | Admitting: Family Medicine

## 2022-10-27 DIAGNOSIS — Z23 Encounter for immunization: Secondary | ICD-10-CM | POA: Insufficient documentation

## 2022-10-27 DIAGNOSIS — M79661 Pain in right lower leg: Secondary | ICD-10-CM | POA: Insufficient documentation

## 2022-10-27 DIAGNOSIS — D708 Other neutropenia: Secondary | ICD-10-CM | POA: Insufficient documentation

## 2022-10-27 HISTORY — DX: Encounter for immunization: Z23

## 2022-10-27 NOTE — Addendum Note (Signed)
Addended by: Lanice Shirts on: 10/27/2022 08:38 PM   Modules accepted: Level of Service

## 2022-10-27 NOTE — Assessment & Plan Note (Signed)
Chronic.  Stable. Currently asymptomatic.  No recommendation for bone marrow biopsy at this time. Follows with oncology every 6 months.

## 2022-10-27 NOTE — Assessment & Plan Note (Signed)
Unclear etiology.  Considered DVT, inflammatory process, iron deficiency, cellulitis, gout, cardiac etiology in differentials.  Doubt cellulitis given no fevers or erythema.  Doubt cardiac etiology given no shortness of breath, tachycardia.  No significant change in recent CBC and CMET Will obtain x-rays of ankle and foot Ultrasound to rule out DVT Start Eliquis 10 mg twice daily x 7 days then 5 mg twice daily.  If ultrasound negative can discontinue Eliquis. Check uric acid, ESR, CRP Elevate legs Consider compression stockings Follow-up results. Strict return precautions provided

## 2022-10-27 NOTE — Assessment & Plan Note (Deleted)
Unclear etiology.  Considered DVT, inflammatory process, iron deficiency, cellulitis, gout, cardiac etiology in differentials.  Doubt cellulitis given no fevers or erythema.  Doubt cardiac etiology given no shortness of breath, tachycardia.  No significant change in recent CBC and CMET Will obtain x-rays of ankle and foot Ultrasound to rule out DVT Start Eliquis 10 mg twice daily x 7 days then 5 mg twice daily.  If ultrasound negative can discontinue Eliquis. Check uric acid, ESR, CRP Elevate legs Consider compression stockings Follow-up results. Strict return precautions provided

## 2022-11-12 NOTE — Patient Instructions (Incomplete)
It was a pleasure meeting you today. Thank you for allowing me to take part in your health care.  Our goals for today as we discussed include:  For your***   For your***  Recommend Shingles vaccine.  This is a 2 dose series and can be given at your local pharmacy.  Please talk to your pharmacist about this.  If you have any questions or concerns, please do not hesitate to call the office at [phone].  I look forward to our next visit and until then take care and stay safe.  Regards,   Carollee Leitz, MD   [facility]

## 2022-11-12 NOTE — Progress Notes (Signed)
   SUBJECTIVE:  No chief complaint on file.  HPI Patient presents to clinic for follow-up right lower extremity pain and edema.   PERTINENT PMH / PSH: ***  OBJECTIVE:  There were no vitals taken for this visit.   Physical Exam  ASSESSMENT/PLAN:  There are no diagnoses linked to this encounter. PDMP reviewed***  No follow-ups on file.  Carollee Leitz, MD

## 2022-11-14 ENCOUNTER — Encounter: Payer: Self-pay | Admitting: Family Medicine

## 2022-11-14 ENCOUNTER — Ambulatory Visit (INDEPENDENT_AMBULATORY_CARE_PROVIDER_SITE_OTHER): Payer: Medicare Other | Admitting: Family Medicine

## 2022-11-14 VITALS — BP 110/68 | HR 71 | Temp 98.1°F | Ht 65.0 in | Wt 150.2 lb

## 2022-11-14 DIAGNOSIS — M7989 Other specified soft tissue disorders: Secondary | ICD-10-CM

## 2022-11-14 DIAGNOSIS — D708 Other neutropenia: Secondary | ICD-10-CM

## 2022-11-14 DIAGNOSIS — M79661 Pain in right lower leg: Secondary | ICD-10-CM

## 2022-11-14 DIAGNOSIS — J302 Other seasonal allergic rhinitis: Secondary | ICD-10-CM

## 2022-11-14 MED ORDER — LEVOCETIRIZINE DIHYDROCHLORIDE 5 MG PO TABS: 5.0000 mg | ORAL_TABLET | Freq: Every evening | ORAL | 3 refills | Status: DC

## 2022-11-14 NOTE — Assessment & Plan Note (Signed)
Chronic. Refill Xyzal

## 2022-11-14 NOTE — Assessment & Plan Note (Addendum)
Chronic.  Improved.  Recent x-rays negative for acute fracture or OA.  Labs reassuring.  Reassuring right lower extremity ultrasound, no indication lower extremity DVT. Continue compression stockings as needed Continue to elevate leg when sitting Remain active and gentle exercises. Continue to monitor, if worsens follow-up PCP

## 2022-11-14 NOTE — Assessment & Plan Note (Signed)
Chronic.  Asymptomatic.  Baseline WBC 2.5-4.0 previously evaluated by oncology.  No indication for bone marrow biopsy at that time Follow-up with oncology

## 2022-11-18 ENCOUNTER — Ambulatory Visit (INDEPENDENT_AMBULATORY_CARE_PROVIDER_SITE_OTHER): Payer: Medicare Other

## 2022-11-18 VITALS — Ht 65.0 in | Wt 150.0 lb

## 2022-11-18 DIAGNOSIS — Z0000 Encounter for general adult medical examination without abnormal findings: Secondary | ICD-10-CM | POA: Diagnosis not present

## 2022-11-18 DIAGNOSIS — Z1231 Encounter for screening mammogram for malignant neoplasm of breast: Secondary | ICD-10-CM

## 2022-11-18 NOTE — Progress Notes (Signed)
Subjective:   Kim Mitchell is a 67 y.o. female who presents for an Initial Medicare Annual Wellness Visit.  Review of Systems    No ROS.  Medicare Wellness Virtual Visit.  Visual/audio telehealth visit, UTA vital signs.   See social history for additional risk factors.   Cardiac Risk Factors include: advanced age (>39mn, >>54women)     Objective:    Today's Vitals   11/18/22 1104  Weight: 150 lb (68 kg)  Height: 5' 5"$  (1.651 m)   Body mass index is 24.96 kg/m.     11/18/2022   11:11 AM 02/14/2022    9:37 AM 01/24/2022    1:34 PM  Advanced Directives  Does Patient Have a Medical Advance Directive? No No No  Would patient like information on creating a medical advance directive? No - Patient declined No - Patient declined Yes (MAU/Ambulatory/Procedural Areas - Information given)    Current Medications (verified) Outpatient Encounter Medications as of 11/18/2022  Medication Sig   Calcium Carbonate-Vitamin D 600-400 MG-UNIT tablet Take 2 tablets by mouth daily.   fluticasone (FLONASE) 50 MCG/ACT nasal spray Place 2 sprays into both nostrils daily.   Iron, Ferrous Sulfate, 325 (65 Fe) MG TABS Take 1 tablet by mouth every other day.   levocetirizine (XYZAL) 5 MG tablet Take 1 tablet (5 mg total) by mouth every evening.   Melatonin 10 MG CAPS Take 10 mg by mouth.   Multiple Vitamins-Minerals (CENTRUM SILVER PO) Take 1 tablet by mouth daily.   No facility-administered encounter medications on file as of 11/18/2022.    Allergies (verified) Sulfa antibiotics and Demerol [meperidine hcl]   History: Past Medical History:  Diagnosis Date   Acute left ankle pain 12/28/2020   Chronic insomnia 02/16/2022   COVID-19    Encounter for administration of vaccine 10/27/2022   Fibroids    confrimed from medical records   GERD (gastroesophageal reflux disease)    H. pylori infection    Heart murmur    IBS (irritable bowel syndrome)    Kidney stone    2004   Leukocytes in urine  12/16/2019   Leukopenia    Screening for blood or protein in urine 12/16/2019   Sickle cell trait ([place]    Soft tissue disorder of ankle 12/28/2020   Swelling of joint of hand 12/16/2019   Vaginal dryness, menopausal 12/16/2019   Past Surgical History:  Procedure Laterality Date   BREAST BIOPSY Right 01/29/2021   Affirm bx-"X" clip-Benign   BREAST BIOPSY Right 03/02/2021   Sffirm bx- coil clip, path pending    BREAST CYST EXCISION Right 50 years ago ~1970   neg   BREAST SURGERY     LAPAROSCOPIC ASSISTED VAGINAL HYSTERECTOMY  11/18/2009   partial hysterectomy only ovaries left no h/o h/o abnormal pap    LITHOTRIPSY Right    2004   TUBAL LIGATION     1985   Family History  Problem Relation Age of Onset   Cancer Sister        jaw    Heart attack Brother    Cancer Brother        GU cancer   Lung cancer Maternal Aunt    Lung cancer Maternal Aunt    Hypertension Maternal Grandmother    Cancer Maternal Grandfather        ?type   Breast cancer Cousin    Uterine cancer Cousin    Hypertension Other        siblings  Social History   Socioeconomic History   Marital status: Married    Spouse name: Not on file   Number of children: Not on file   Years of education: Not on file   Highest education level: Not on file  Occupational History   Not on file  Tobacco Use   Smoking status: Never   Smokeless tobacco: Never  Substance and Sexual Activity   Alcohol use: Not Currently   Drug use: Never   Sexual activity: Not Currently  Other Topics Concern   Not on file  Social History Narrative   7 siblings, 4 living as of 02/12/21    Retired Producer, television/film/video    2 female kids 22, 29 02/12/21    Grew up [place] to [place] [employer] [place] 2nd grade relocated here [employer] eduation   Social Determinants of Health   Financial Resource Strain: [place]  (11/18/2022)   Overall Financial Resource Strain (CARDIA)    Difficulty of Paying Living Expenses: Not hard at all  Food Insecurity: No  Food Insecurity (11/18/2022)   Hunger Vital Sign    Worried About Running Out of Food in the Last Year: Never true    [place] in the Last Year: Never true  Transportation Needs: No Transportation Needs (11/18/2022)   PRAPARE - Hydrologist (Medical): No    Lack of Transportation (Non-Medical): No  Physical Activity: Not on file  Stress: No Stress Concern Present (11/18/2022)   Welcome    Feeling of Stress : Not at all  Social Connections: Unknown (11/18/2022)   Social Connection and Isolation Panel [NHANES]    Frequency of Communication with Friends and Family: More than three times a week    Frequency of Social Gatherings with Friends and Family: More than three times a week    Attends Religious Services: More than 4 times per year    Active Member of [employer] or Organizations: Not on file    Attends Archivist Meetings: Not on file    Marital Status: Not on file    Tobacco Counseling Counseling given: Not Answered   Clinical Intake:  Pre-visit preparation completed: Yes        Diabetes: No  How often do you need to have someone help you when you read instructions, pamphlets, or other written materials from your doctor or pharmacy?: 1 - Never    Interpreter Needed?: No      Activities of Daily Living    11/18/2022   11:12 AM  In your present state of health, do you have any difficulty performing the following activities:  Hearing? 0  Vision? 0  Difficulty concentrating or making decisions? 0  Walking or climbing stairs? 0  Dressing or bathing? 0  Doing errands, shopping? 0  Preparing Food and eating ? N  Using the Toilet? N  In the past six months, have you accidently leaked urine? N  Do you have problems with loss of bowel control? N  Managing your Medications? N  Managing your Finances? N  Housekeeping or managing your Housekeeping? N    Patient  Care Team: Carollee Leitz, MD as PCP - General (Family Medicine) Sindy Guadeloupe, MD as Consulting Physician (Hematology and Oncology)  Indicate any recent Medical Services you may have received from other than [facility] providers in the past year (date may be approximate).     Assessment:   This is  a routine wellness examination for [name].  I connected with  Kim Mitchell on 11/18/22 by a audio enabled telemedicine application and verified that I am speaking with the correct person using two identifiers.  Patient Location: Home  Provider Location: Office/Clinic  I discussed the limitations of evaluation and management by telemedicine. The patient expressed understanding and agreed to proceed.   Hearing/Vision screen Hearing Screening - Comments:: Patient is able to hear conversational tones without difficulty.  No issues reported.   Vision Screening - Comments:: Followed by Dr. Pauline Good  Wears corrective lenses Cataract extraction, bilateral They have seen their ophthalmologist in the last 12 months.    Dietary issues and exercise activities discussed: Current Exercise Habits: Home exercise routine, Intensity: Mild   Goals Addressed               This Visit's Progress     Patient Stated     Increase physical activity (pt-stated)         Depression Screen    11/18/2022   11:12 AM 11/14/2022    9:31 AM 10/19/2022    3:07 PM 02/16/2022   10:47 AM 08/18/2021   11:40 AM 02/12/2021    1:44 PM 12/23/2020   11:33 AM  PHQ 2/9 Scores  PHQ - 2 Score 0 0 0 0 0 0 0  PHQ- 9 Score 0 1 2   0 4    Fall Risk    11/18/2022   11:11 AM 11/14/2022    9:30 AM 10/19/2022    3:07 PM 02/16/2022   10:47 AM 08/18/2021   11:40 AM  Fall Risk   Falls in the past year? 0 0 0 0 0  Number falls in past yr: 0 0 0 0 0  Injury with Fall?  0 0 0 0  Risk for fall due to :  No Fall Risks No Fall Risks No Fall Risks   Follow up Falls evaluation completed;Falls prevention discussed Falls evaluation  completed Falls evaluation completed Falls evaluation completed Falls evaluation completed    FALL RISK PREVENTION PERTAINING TO THE HOME: Home free of loose throw rugs in walkways, pet beds, electrical cords, etc? Yes  Adequate lighting in your home to reduce risk of falls? Yes   ASSISTIVE DEVICES UTILIZED TO PREVENT FALLS: Life alert? No  Use of a cane, walker or w/c? No   TIMED UP AND GO: Was the test performed? No .   Cognitive Function:        11/18/2022   11:13 AM  6CIT Screen  What Year? 0 points  What month? 0 points  What time? 0 points  Count back from 20 0 points  Months in reverse 0 points  Repeat phrase 0 points  Total Score 0 points    Immunizations Immunization History  Administered Date(s) Administered   Fluad Quad(high Dose 65+) 10/19/2022   Influenza, High Dose Seasonal PF 09/21/2021   PFIZER(Purple Top)SARS-COV-2 Vaccination 12/27/2019, 01/21/2020, 09/14/2020, 07/10/2021   PNEUMOCOCCAL CONJUGATE-20 10/19/2022   Tdap 02/18/2021   Shingrix Completed?: No.    Education has been provided regarding the importance of this vaccine. Patient has been advised to call insurance company to determine out of pocket expense if they have not yet received this vaccine. Advised may also receive vaccine at local pharmacy or Health Dept. Verbalized acceptance and understanding.  Screening Tests Health Maintenance  Topic Date Due   COVID-19 Vaccine (5 - 2023-24 season) 11/30/2022 (Originally 06/10/2022)   Zoster Vaccines- Shingrix (1 of  2) 02/16/2023 (Originally 09/21/1975)   Medicare Annual Wellness ([place])  11/19/2023   MAMMOGRAM  01/27/2024   COLONOSCOPY (Pts 45-65yr Insurance coverage will need to be confirmed)  10/18/2028   DTaP/Tdap/Td (2 - Td or Tdap) 02/19/2031   Pneumonia Vaccine 67 Years old  Completed   INFLUENZA VACCINE  Completed   DEXA SCAN  Completed   Hepatitis C Screening  Completed   HPV VACCINES  Aged Out   Health Maintenance There are no  preventive care reminders to display for this patient.  Mammogram- ordered. Agrees to schedule. Number provided [phone]  Lung Cancer Screening: (Low Dose CT Chest recommended if Age 67-80years, 30 pack-year currently smoking OR have quit w/in 15years.) does not qualify.   Hepatitis C Screening: Completed 12/2019.  Vision Screening: Recommended annual ophthalmology exams for early detection of glaucoma and other disorders of the eye.  Dental Screening: Recommended annual dental exams for proper oral hygiene  Community Resource Referral / Chronic Care Management: CRR required this visit?  No   CCM required this visit?  No      Plan:     I have personally reviewed and noted the following in the patient's chart:   Medical and social history Use of alcohol, tobacco or illicit drugs  Current medications and supplements including opioid prescriptions. Patient is not currently taking opioid prescriptions. Functional ability and status Nutritional status Physical activity Advanced directives List of other physicians Hospitalizations, surgeries, and ER visits in previous 12 months Vitals Screenings to include cognitive, depression, and falls Referrals and appointments  In addition, I have reviewed and discussed with patient certain preventive protocols, quality metrics, and best practice recommendations. A written personalized care plan for preventive services as well as general preventive health recommendations were provided to patient.     DLeta Jungling LPN   [id number]

## 2022-11-18 NOTE — Patient Instructions (Addendum)
[name] , Thank you for taking time to come for your Medicare Wellness Visit. I appreciate your ongoing commitment to your health goals. Please review the following plan we discussed and let me know if I can assist you in the future.   These are the goals we discussed:  Goals       Patient Stated     Increase physical activity (pt-stated)        This is a list of the screening recommended for you and due dates:  Health Maintenance  Topic Date Due   COVID-19 Vaccine (5 - 2023-24 season) 11/30/2022*   Zoster (Shingles) Vaccine (1 of 2) 02/16/2023*   Medicare Annual Wellness Visit  11/19/2023   Mammogram  01/27/2024   Colon Cancer Screening  10/18/2028   DTaP/Tdap/Td vaccine (2 - Td or Tdap) 02/19/2031   Pneumonia Vaccine  Completed   Flu Shot  Completed   DEXA scan (bone density measurement)  Completed   Hepatitis C Screening: USPSTF Recommendation to screen - Ages 77-79 yo.  Completed   HPV Vaccine  Aged Out  *Topic was postponed. The date shown is not the original due date.   Conditions/risks identified: none new  Next appointment: Follow up in one year for your annual wellness visit    Preventive Care 65 Years and Older, Female Preventive care refers to lifestyle choices and visits with your health care provider that can promote health and wellness. What does preventive care include? A yearly physical exam. This is also called an annual well check. Dental exams once or twice a year. Routine eye exams. Ask your health care provider how often you should have your eyes checked. Personal lifestyle choices, including: Daily care of your teeth and gums. Regular physical activity. Eating a healthy diet. Avoiding tobacco and drug use. Limiting alcohol use. Practicing safe sex. Taking low-dose aspirin every day. Taking vitamin and mineral supplements as recommended by your health care provider. What happens during an annual well check? The services and screenings done  by your health care provider during your annual well check will depend on your age, overall health, lifestyle risk factors, and family history of disease. Counseling  Your health care provider may ask you questions about your: Alcohol use. Tobacco use. Drug use. Emotional well-being. Home and relationship well-being. Sexual activity. Eating habits. History of falls. Memory and ability to understand (cognition). Work and work Statistician. Reproductive health. Screening  You may have the following tests or measurements: Height, weight, and BMI. Blood pressure. Lipid and cholesterol levels. These may be checked every 5 years, or more frequently if you are over 79 years old. Skin check. Lung cancer screening. You may have this screening every year starting at age 54 if you have a 30-pack-year history of smoking and currently smoke or have quit within the past 15 years. Fecal occult blood test (FOBT) of the stool. You may have this test every year starting at age 56. Flexible sigmoidoscopy or colonoscopy. You may have a sigmoidoscopy every 5 years or a colonoscopy every 10 years starting at age 64. Hepatitis C blood test. Hepatitis B blood test. Sexually transmitted disease (STD) testing. Diabetes screening. This is done by checking your blood sugar (glucose) after you have not eaten for a while (fasting). You may have this done every 1-3 years. Bone density scan. This is done to screen for osteoporosis. You may have this done starting at age 13. Mammogram. This may be done every 1-2 years. Talk to your  health care provider about how often you should have regular mammograms. Talk with your health care provider about your test results, treatment options, and if necessary, the need for more tests. Vaccines  Your health care provider may recommend certain vaccines, such as: Influenza vaccine. This is recommended every year. Tetanus, diphtheria, and acellular pertussis (Tdap, Td) vaccine. You  may need a Td booster every 10 years. Zoster vaccine. You may need this after age 65. Pneumococcal 13-valent conjugate (PCV13) vaccine. One dose is recommended after age 75. Pneumococcal polysaccharide (PPSV23) vaccine. One dose is recommended after age 72. Talk to your health care provider about which screenings and vaccines you need and how often you need them. This information is not intended to replace advice given to you by your health care provider. Make sure you discuss any questions you have with your health care provider. Document Released: 10/23/2015 Document Revised: 06/15/2016 Document Reviewed: 07/28/2015 Elsevier Interactive Patient Education  2017 [place] Prevention in the Home Falls can cause injuries. They can happen to people of all ages. There are many things you can do to make your home safe and to help prevent falls. What can I do on the outside of my home? Regularly fix the edges of walkways and driveways and fix any cracks. Remove anything that might make you trip as you walk through a door, such as a raised step or threshold. Trim any bushes or trees on the path to your home. Use bright outdoor lighting. Clear any walking paths of anything that might make someone trip, such as rocks or tools. Regularly check to see if handrails are loose or broken. Make sure that both sides of any steps have handrails. Any raised decks and porches should have guardrails on the edges. Have any leaves, snow, or ice cleared regularly. Use sand or salt on walking paths during winter. Clean up any spills in your garage right away. This includes oil or grease spills. What can I do in the bathroom? Use night lights. Install grab bars by the toilet and in the tub and shower. Do not use towel bars as grab bars. Use non-skid mats or decals in the tub or shower. If you need to sit down in the shower, use a plastic, non-slip stool. Keep the floor dry. Clean up any water that spills  on the floor as soon as it happens. Remove soap buildup in the tub or shower regularly. Attach bath mats securely with double-sided non-slip rug tape. Do not have throw rugs and other things on the floor that can make you trip. What can I do in the bedroom? Use night lights. Make sure that you have a light by your bed that is easy to reach. Do not use any sheets or blankets that are too big for your bed. They should not hang down onto the floor. Have a firm chair that has side arms. You can use this for support while you get dressed. Do not have throw rugs and other things on the floor that can make you trip. What can I do in the kitchen? Clean up any spills right away. Avoid walking on wet floors. Keep items that you use a lot in easy-to-reach places. If you need to reach something above you, use a strong step stool that has a grab bar. Keep electrical cords out of the way. Do not use floor polish or wax that makes floors slippery. If you must use wax, use non-skid floor wax. Do not have  throw rugs and other things on the floor that can make you trip. What can I do with my stairs? Do not leave any items on the stairs. Make sure that there are handrails on both sides of the stairs and use them. Fix handrails that are broken or loose. Make sure that handrails are as long as the stairways. Check any carpeting to make sure that it is firmly attached to the stairs. Fix any carpet that is loose or worn. Avoid having throw rugs at the top or bottom of the stairs. If you do have throw rugs, attach them to the floor with carpet tape. Make sure that you have a light switch at the top of the stairs and the bottom of the stairs. If you do not have them, ask someone to add them for you. What else can I do to help prevent falls? Wear shoes that: Do not have high heels. Have rubber bottoms. Are comfortable and fit you well. Are closed at the toe. Do not wear sandals. If you use a stepladder: Make  sure that it is fully opened. Do not climb a closed stepladder. Make sure that both sides of the stepladder are locked into place. Ask someone to hold it for you, if possible. Clearly mark and make sure that you can see: Any grab bars or handrails. First and last steps. Where the edge of each step is. Use tools that help you move around (mobility aids) if they are needed. These include: Canes. Walkers. Scooters. Crutches. Turn on the lights when you go into a dark area. Replace any light bulbs as soon as they burn out. Set up your furniture so you have a clear path. Avoid moving your furniture around. If any of your floors are uneven, fix them. If there are any pets around you, be aware of where they are. Review your medicines with your doctor. Some medicines can make you feel dizzy. This can increase your chance of falling. Ask your doctor what other things that you can do to help prevent falls. This information is not intended to replace advice given to you by your health care provider. Make sure you discuss any questions you have with your health care provider. Document Released: 07/23/2009 Document Revised: 03/03/2016 Document Reviewed: 10/31/2014 Elsevier Interactive Patient Education  2017 [place] A mammogram is an X-ray of the breasts. This procedure can screen for and detect any changes that may indicate breast cancer. Mammograms are regularly done beginning at age 28 for women with average risk. A man may have a mammogram if he has a lump or swelling in his breast tissue. A mammogram can also identify other changes and variations in the breast, such as: Inflammation of the breast tissue (mastitis). An infected area that contains a collection of pus (abscess). A fluid-filled sac (cyst). Tumors that are not cancerous (benign). Fibrocystic changes. This is when breast tissue becomes denser and can make the tissue feel rope-like or uneven under the skin. Women at  higher risk for breast cancer need earlier and more comprehensive screening for abnormal changes. Breast tomosynthesis, or three-dimensional (3D) mammography, and digital breast tomosynthesis are advanced forms of imaging that create 3D pictures of the breasts. Tell a health care provider: About any allergies you have. If you have breast implants. If you have had previous breast disease, biopsy, or surgery. If you have a family history of breast cancer. If you are breastfeeding. Whether you are pregnant or may be pregnant. What are  the risks? Generally, this is a safe procedure. However, problems may occur, including: Exposure to radiation. Radiation levels are very low with this test. The need for more tests. The mammogram fails to detect certain cancers or the results are misinterpreted. Difficulty with detecting breast cancer in women with dense breasts. What happens before the procedure? Schedule your test about 1-2 weeks after your menstrual period if you are menstruating. This is usually when your breasts are the least tender. If you have had a mammogram done at a different facility in the past, get the mammogram X-rays or have them sent to your current exam facility. The new and old images will be compared. Wash your breasts and underarms on the day of the test. Do not wear deodorants, perfumes, lotions, or powders anywhere on your body on the day of the test. Remove any jewelry from your neck. Wear clothes that you can change into and out of easily. What happens during the procedure?  You will undress from the waist up and put on a gown that opens in the front. You will stand in front of the X-ray machine. Each breast will be placed between two plastic or glass plates. The plates will compress your breast for a few seconds. Try to stay as relaxed as possible. This procedure does not cause any harm to your breasts. Any discomfort you feel will be very brief. X-rays will be taken from  different angles of each breast. The procedure may vary among health care providers and hospitals. What can I expect after the procedure? The mammogram will be examined by a specialist (radiologist). You may need to repeat certain parts of the test, depending on the quality of the images. This is done if the radiologist needs a better view of the breast tissue. You may resume your normal activities. It is up to you to get the results of your procedure. Ask your health care provider, or the department that is doing the procedure, when your results will be ready. Summary A mammogram is an X-ray of the breasts. This procedure can screen for and detect any changes that may indicate breast cancer. A man may have a mammogram if he has a lump or swelling in his breast tissue. If you have had a mammogram done at a different facility in the past, get the mammogram X-rays or have them sent to your current exam facility in order to compare them. Schedule your test about 1-2 weeks after your menstrual period if you are menstruating. Ask when your test results will be ready. Make sure you get your test results. This information is not intended to replace advice given to you by your health care provider. Make sure you discuss any questions you have with your health care provider. Document Revised: 06/09/2021 Document Reviewed: 07/27/2020 Elsevier Patient Education  [place].

## 2023-01-30 ENCOUNTER — Ambulatory Visit
Admission: RE | Admit: 2023-01-30 | Discharge: 2023-01-30 | Disposition: A | Discharge status: Home / Self Care | Payer: Medicare Other | Source: Ambulatory Visit | Attending: Family Medicine | Admitting: Family Medicine

## 2023-01-30 DIAGNOSIS — Z1231 Encounter for screening mammogram for malignant neoplasm of breast: Secondary | ICD-10-CM | POA: Diagnosis present

## 2023-11-17 LAB — LIPID PANEL
Cholesterol: 143
HDL: 47
LDL: 80
Triglycerides: 80 (ref 40–160)

## 2023-11-17 LAB — HEMOGLOBIN A1C: Hemoglobin A1C: 5.8

## 2023-11-17 LAB — POCT ABI - SCREENING FOR PILOT NO CHARGE
Left ABI: 1.06
Right ABI: 1.12

## 2023-11-17 NOTE — Progress Notes (Signed)
 Advised to contact PCP about blood glucose level

## 2023-12-05 ENCOUNTER — Ambulatory Visit (INDEPENDENT_AMBULATORY_CARE_PROVIDER_SITE_OTHER): Payer: Medicare Other | Admitting: *Deleted

## 2023-12-05 VITALS — Ht 65.0 in | Wt 150.0 lb

## 2023-12-05 DIAGNOSIS — Z780 Asymptomatic menopausal state: Secondary | ICD-10-CM | POA: Diagnosis not present

## 2023-12-05 DIAGNOSIS — Z0000 Encounter for general adult medical examination without abnormal findings: Secondary | ICD-10-CM

## 2023-12-05 DIAGNOSIS — Z1231 Encounter for screening mammogram for malignant neoplasm of breast: Secondary | ICD-10-CM

## 2023-12-05 NOTE — Patient Instructions (Signed)
 [name] , Thank you for taking time to come for your Medicare Wellness Visit. I appreciate your ongoing commitment to your health goals. Please review the following plan we discussed and let me know if I can assist you in the future.   Referrals/Orders/Follow-Ups/Clinician Recommendations: Your mammogram and Bone Density has been ordered. Consider updating your shingles vaccines. You have an order for:  []   2D Mammogram  [x]   3D Mammogram  [x]   Bone Density     Please call for appointment:  [facility]  [address] [place] [address] [phone]   Make sure to wear two-piece clothing.  No lotions, powders, or deodorants the day of the appointment. Make sure to bring picture ID and insurance card.  Bring list of medications you are currently taking including any supplements.   Schedule your [place] screening mammogram through MyChart!   Log into your MyChart account.  Go to 'Visit' (or 'Appointments' if on mobile App) --> Schedule an Appointment  Under 'Select a Reason for Visit' choose the Mammogram Screening option.  Complete the pre-visit questions and select the time and place that best fits your schedule.    This is a list of the screening recommended for you and due dates:  Health Maintenance  Topic Date Due   Zoster (Shingles) Vaccine (1 of 2) Never done   COVID-19 Vaccine (6 - 2024-25 season) 09/29/2023   Medicare Annual Wellness Visit  12/04/2024   Mammogram  01/29/2025   Colon Cancer Screening  10/18/2028   DTaP/Tdap/Td vaccine (2 - Td or Tdap) 02/19/2031   Pneumonia Vaccine  Completed   Flu Shot  Completed   DEXA scan (bone density measurement)  Completed   Hepatitis C Screening  Completed   HPV Vaccine  Aged Out    Advanced directives: (Declined) Advance directive discussed with you today. Even though you declined this today, please call our office should you change your mind, and we can  give you the proper paperwork for you to fill out.  Next Medicare Annual Wellness Visit scheduled for next year: Yes 12/10/24 @ 1:00

## 2023-12-05 NOTE — Progress Notes (Signed)
 Subjective:   Kim Mitchell is a 68 y.o. who presents for a Medicare Wellness preventive visit.  Visit Complete: Virtual I connected with  Kim Mitchell on 12/05/23 by a audio enabled telemedicine application and verified that I am speaking with the correct person using two identifiers.  Patient Location: Home  Provider Location: Office/Clinic  I discussed the limitations of evaluation and management by telemedicine. The patient expressed understanding and agreed to proceed.  Vital Signs: Because this visit was a virtual/telehealth visit, some criteria may be missing or patient reported. Any vitals not documented were not able to be obtained and vitals that have been documented are patient reported.  VideoDeclined- This patient declined Librarian, academic. Therefore the visit was completed with audio only.  AWV Questionnaire: Yes: Patient Medicare AWV questionnaire was completed by the patient on 12/04/23; I have confirmed that all information answered by patient is correct and no changes since this date.  Cardiac Risk Factors include: advanced age (>25men, >42 women)     Objective:    Today's Vitals   12/05/23 0807  Weight: 150 lb (68 kg)  Height: 5\' 5"  (1.651 m)   Body mass index is 24.96 kg/m.     12/05/2023    8:20 AM 11/18/2022   11:11 AM 02/14/2022    9:37 AM 01/24/2022    1:34 PM  Advanced Directives  Does Patient Have a Medical Advance Directive? No No No No  Would patient like information on creating a medical advance directive? No - Patient declined No - Patient declined No - Patient declined Yes (MAU/Ambulatory/Procedural Areas - Information given)    Current Medications (verified) Outpatient Encounter Medications as of 12/05/2023  Medication Sig   acetaminophen (TYLENOL) 500 MG tablet Take 500 mg by mouth every 6 (six) hours as needed.   Calcium Carbonate-Vitamin D 600-400 MG-UNIT tablet Take 2 tablets by mouth daily.    docusate sodium (COLACE) 100 MG capsule Take 100 mg by mouth as needed for mild constipation.   fluticasone (FLONASE) 50 MCG/ACT nasal spray Place 2 sprays into both nostrils daily.   levocetirizine (XYZAL) 5 MG tablet Take 1 tablet (5 mg total) by mouth every evening.   Melatonin 10 MG CAPS Take 10 mg by mouth.   Multiple Vitamins-Minerals (CENTRUM SILVER PO) Take 1 tablet by mouth daily.   Iron, Ferrous Sulfate, 325 (65 Fe) MG TABS Take 1 tablet by mouth every other day. (Patient not taking: Reported on 12/05/2023)   No facility-administered encounter medications on file as of 12/05/2023.    Allergies (verified) Sulfa antibiotics and Demerol [meperidine hcl]   History: Past Medical History:  Diagnosis Date   Acute left ankle pain 12/28/2020   Chronic insomnia 02/16/2022   COVID-19    Encounter for administration of vaccine 10/27/2022   Fibroids    confrimed from medical records   GERD (gastroesophageal reflux disease)    H. pylori infection    Heart murmur    IBS (irritable bowel syndrome)    Kidney stone    2004   Leukocytes in urine 12/16/2019   Leukopenia    Screening for blood or protein in urine 12/16/2019   Sickle cell trait (HCC)    Soft tissue disorder of ankle 12/28/2020   Swelling of joint of hand 12/16/2019   Vaginal dryness, menopausal 12/16/2019   Past Surgical History:  Procedure Laterality Date   BREAST BIOPSY Right 01/29/2021   Affirm bx-"X" clip-Benign   BREAST BIOPSY Right 03/02/2021  Sffirm bx- coil clip, path pending    BREAST CYST EXCISION Right 50 years ago ~1970   neg   BREAST SURGERY     LAPAROSCOPIC ASSISTED VAGINAL HYSTERECTOMY  11/18/2009   partial hysterectomy only ovaries left no h/o h/o abnormal pap    LITHOTRIPSY Right    2004   TUBAL LIGATION     1985   Family History  Problem Relation Age of Onset   Cancer Sister        jaw    Heart attack Brother    Cancer Brother        GU cancer   Lung cancer Maternal Aunt    Lung cancer  Maternal Aunt    Hypertension Maternal Grandmother    Cancer Maternal Grandfather        ?type   Breast cancer Cousin    Uterine cancer Cousin    Hypertension Other        siblings    Social History   Socioeconomic History   Marital status: Married    Spouse name: Not on file   Number of children: Not on file   Years of education: Not on file   Highest education level: Not on file  Occupational History   Not on file  Tobacco Use   Smoking status: Never   Smokeless tobacco: Never  Substance and Sexual Activity   Alcohol use: Not Currently   Drug use: Never   Sexual activity: Not Currently  Other Topics Concern   Not on file  Social History Narrative   7 siblings, 4 living as of 02/12/21    Retired Arts administrator    2 female kids 45, 37 02/12/21    G[place] up [place] to [place] [place] 2nd grade relocated here [employer] eduation   Social Drivers of Health   Financial Resource Strain: Low Risk  (12/04/2023)   Overall Financial Resource Strain (CARDIA)    Difficulty of Paying Living Expenses: Not hard at all  Food Insecurity: No Food Insecurity (12/05/2023)   Hunger Vital Sign    Worried About Running Out of Food in the Last Year: Never true    Ran Out of Food in the Last Year: Never true  Recent Concern: Food Insecurity - Food Insecurity Present (11/17/2023)   Hunger Vital Sign    Worried About Running Out of Food in the Last Year: Never true    Ran Out of Food in the Last Year: Sometimes true  Transportation Needs: No Transportation Needs (12/04/2023)   PRAPARE - Administrator, Civil Service (Medical): No    Lack of Transportation (Non-Medical): No  Physical Activity: Sufficiently Active (12/05/2023)   Exercise Vital Sign    Days of Exercise per Week: 4 days    Minutes of Exercise per Session: 40 min  Stress: No Stress Concern Present (12/05/2023)   [employer] of Occupational Health - Occupational Stress Questionnaire    Feeling of Stress : Only a little   Social Connections: Socially Integrated (12/04/2023)   Social Connection and Isolation Panel [NHANES]    Frequency of Communication with Friends and Family: More than three times a week    Frequency of Social Gatherings with Friends and Family: Once a week    Attends Religious Services: More than 4 times per year    Active Member of [employer] or Organizations: Yes    Attends Engineer, structural: More than 4 times per year    Marital Status: Married  Tobacco Counseling Counseling given: Not Answered    Clinical Intake:  Pre-visit preparation completed: Yes  Pain : No/denies pain     BMI - recorded: 24.96 Nutritional Status: BMI of 19-24  Normal Nutritional Risks: None Diabetes: No  How often do you need to have someone help you when you read instructions, pamphlets, or other written materials from your doctor or pharmacy?: 1 - Never  Interpreter Needed?: No  Information entered by :: R.Lauriana Denes LPN   Activities of Daily Living     12/05/2023    8:22 AM 11/28/2023    6:57 PM  In your present state of health, do you have any difficulty performing the following activities:  Hearing? 0 0  Vision? 0 0  Difficulty concentrating or making decisions? 0 0  Walking or climbing stairs? 0 0  Dressing or bathing? 0 0  Doing errands, shopping? 0 0  Preparing Food and eating ? N N  Using the Toilet? N N  In the past six months, have you accidently leaked urine? N N  Do you have problems with loss of bowel control? N N  Managing your Medications? N N  Managing your Finances? N N  Housekeeping or managing your Housekeeping? N N    Patient Care Team: Dana Allan, MD as PCP - General (Family Medicine) Creig Hines, MD as Consulting Physician (Hematology and Oncology)  Indicate any recent Medical Services you may have received from other than [facility] providers in the past year (date may be approximate).     Assessment:   This is a routine wellness examination for  [place].  Hearing/Vision screen Hearing Screening - Comments:: No issues Vision Screening - Comments:: Glasses and contacts   Goals Addressed             This Visit's Progress    Patient Stated       Wants to exercise more       Depression Screen     12/05/2023    8:14 AM 11/18/2022   11:12 AM 11/14/2022    9:31 AM 10/19/2022    3:07 PM 02/16/2022   10:47 AM 08/18/2021   11:40 AM 02/12/2021    1:44 PM  PHQ 2/9 Scores  PHQ - 2 Score 0 0 0 0 0 0 0  PHQ- 9 Score 0 0 1 2   0    Fall Risk     12/05/2023    8:11 AM 11/28/2023    6:57 PM 11/18/2022   11:11 AM 11/14/2022    9:30 AM 10/19/2022    3:07 PM  Fall Risk   Falls in the past year? 0 0 0 0 0  Number falls in past yr: 0 0 0 0 0  Injury with Fall? 0 0  0 0  Risk for fall due to : No Fall Risks   No Fall Risks No Fall Risks  Follow up Falls prevention discussed;Falls evaluation completed  Falls evaluation completed;Falls prevention discussed Falls evaluation completed Falls evaluation completed    MEDICARE RISK AT HOME:  Medicare Risk at Home Any stairs in or around the home?: Yes If so, are there any without handrails?: No Home free of loose throw rugs in walkways, pet beds, electrical cords, etc?: Yes Adequate lighting in your home to reduce risk of falls?: Yes Life alert?: No Use of a cane, walker or w/c?: No Grab bars in the bathroom?: No Shower chair or bench in shower?: No Elevated toilet seat or a handicapped toilet?: Yes  TIMED UP AND GO:  Was the test performed?  No  Cognitive Function: 6CIT completed        12/05/2023    8:23 AM 12/05/2023    8:20 AM 11/18/2022   11:13 AM  6CIT Screen  What Year? 0 points 0 points 0 points  What month? 0 points 0 points 0 points  What time? 0 points 0 points 0 points  Count back from 20 0 points 0 points 0 points  Months in reverse 0 points  0 points  Repeat phrase 0 points  0 points  Total Score 0 points  0 points    Immunizations Immunization History   Administered Date(s) Administered   Fluad Quad(high Dose 65+) 10/19/2022   Influenza, High Dose Seasonal PF 09/21/2021   Influenza-Unspecified 08/04/2023   PFIZER(Purple Top)SARS-COV-2 Vaccination 12/27/2019, 01/21/2020, 09/14/2020, 07/10/2021   PNEUMOCOCCAL CONJUGATE-20 10/19/2022   Pfizer(Comirnaty)Fall Seasonal Vaccine 12 years and older 08/04/2023   Tdap 02/18/2021    Screening Tests Health Maintenance  Topic Date Due   Zoster Vaccines- Shingrix (1 of 2) Never done   COVID-19 Vaccine (6 - 2024-25 season) 09/29/2023   Medicare Annual Wellness (AWV)  12/04/2024   MAMMOGRAM  01/29/2025   Colonoscopy  10/18/2028   DTaP/Tdap/Td (2 - Td or Tdap) 02/19/2031   Pneumonia Vaccine 83+ Years old  Completed   INFLUENZA VACCINE  Completed   DEXA SCAN  Completed   Hepatitis C Screening  Completed   HPV VACCINES  Aged Out    Health Maintenance  Health Maintenance Due  Topic Date Due   Zoster Vaccines- Shingrix (1 of 2) Never done   COVID-19 Vaccine (6 - 2024-25 season) 09/29/2023   Health Maintenance Items Addressed: Mammogram ordered Bone Density ordered  Additional Screening:  Vision Screening: Recommended annual ophthalmology exams for early detection of glaucoma and other disorders of the eye.  Dental Screening: Recommended annual dental exams for proper oral hygiene  Community Resource Referral / Chronic Care Management: CRR required this visit?  No   CCM required this visit?  No     Plan:     I have personally reviewed and noted the following in the patient's chart:   Medical and social history Use of alcohol, tobacco or illicit drugs  Current medications and supplements including opioid prescriptions. Patient is not currently taking opioid prescriptions. Functional ability and status Nutritional status Physical activity Advanced directives List of other physicians Hospitalizations, surgeries, and ER visits in previous 12 months Vitals Screenings to  include cognitive, depression, and falls Referrals and appointments  In addition, I have reviewed and discussed with patient certain preventive protocols, quality metrics, and best practice recommendations. A written personalized care plan for preventive services as well as general preventive health recommendations were provided to patient.     Sydell Axon, LPN   01/16/8118   After Visit Summary: (MyChart) Due to this being a telephonic visit, the after visit summary with patients personalized plan was offered to patient via MyChart   Notes: Nothing significant to report at this time.

## 2023-12-19 NOTE — Progress Notes (Signed)
 Patient attended screening event on 11/17/2023. At the screening event pt BP was 142/86, Hemoglobin A1c 5.8. At the event pt documented PCP as Dana Allan, MD, pt document Medicare and Blue Cross & [facility] as insurance coverage. Pt documented no tobacco use. Pt did not note SDOH needs at event. At the screening event pt was advised to contact PCP about blood glucose level.   Per chart review PCP confirmed as Dana Allan, MED. Pt insurance coverage is Medicare and [employer]. Post event initial f/u note, Pt last office visit with PCP was on 11/14/2022 at [facility] at [place] station. At the visit on 11/14/2022 pt BP was 110/68. Chart review also indicates pt has a future appt with Dana Allan, MD at [facility] at [place] station on 01/09/2024 for a f/u.   Post event initial f/u on 12/19/2023, [facility] called pt to let pt know to f/u with her pcp on her next appt on 01/09/2024 about blood glucose levels and BP screening result from the event. Pt Declined SDOH needs.  No additional Health equity team support indicated at this time.

## 2024-01-09 ENCOUNTER — Ambulatory Visit (INDEPENDENT_AMBULATORY_CARE_PROVIDER_SITE_OTHER): Payer: Medicare Other | Admitting: Family Medicine

## 2024-01-09 ENCOUNTER — Encounter: Payer: Self-pay | Admitting: Family Medicine

## 2024-01-09 VITALS — BP 112/76 | HR 85 | Temp 98.4°F | Resp 20 | Ht 65.0 in | Wt 157.0 lb

## 2024-01-09 DIAGNOSIS — Z13220 Encounter for screening for lipoid disorders: Secondary | ICD-10-CM | POA: Diagnosis not present

## 2024-01-09 DIAGNOSIS — R7309 Other abnormal glucose: Secondary | ICD-10-CM

## 2024-01-09 DIAGNOSIS — E559 Vitamin D deficiency, unspecified: Secondary | ICD-10-CM

## 2024-01-09 DIAGNOSIS — E538 Deficiency of other specified B group vitamins: Secondary | ICD-10-CM | POA: Diagnosis not present

## 2024-01-09 DIAGNOSIS — J302 Other seasonal allergic rhinitis: Secondary | ICD-10-CM

## 2024-01-09 DIAGNOSIS — D708 Other neutropenia: Secondary | ICD-10-CM

## 2024-01-09 DIAGNOSIS — N941 Unspecified dyspareunia: Secondary | ICD-10-CM

## 2024-01-09 DIAGNOSIS — T148XXA Other injury of unspecified body region, initial encounter: Secondary | ICD-10-CM | POA: Diagnosis not present

## 2024-01-09 DIAGNOSIS — D509 Iron deficiency anemia, unspecified: Secondary | ICD-10-CM

## 2024-01-09 MED ORDER — OMEPRAZOLE 20 MG PO CPDR: 20.0000 mg | DELAYED_RELEASE_CAPSULE | Freq: Every day | ORAL | 0 refills | Status: DC

## 2024-01-09 MED ORDER — TIZANIDINE HCL 4 MG PO TABS
2.0000 mg | ORAL_TABLET | Freq: Four times a day (QID) | ORAL | 0 refills | Status: DC | PRN
Start: 2024-01-09 — End: 2024-06-05

## 2024-01-09 MED ORDER — NABUMETONE 500 MG PO TABS: 500.0000 mg | ORAL_TABLET | Freq: Every day | ORAL | 0 refills | Status: AC

## 2024-01-09 NOTE — Patient Instructions (Addendum)
 It was a pleasure meeting you today. Thank you for allowing me to take part in your health care.  Our goals for today as we discussed include:  Start Relafen 500 mg two times a day as needed Start Zanaflex 2 mg at night, can increase to 4 mg if needed Take Omeprazole 20 mg daily when taking Relafen to help with upset stomach Heat/Ice as needed Tylenol 500 mg every 6 hours as needed Lidocaine patch every 12 hours as needed Diclofenac gel every 6 hours as needed Heat/Ice as needed Massage therapy   Referral sent for pelvic floor therapy  For Menopausal information [facility] MD [address] [place], [place], [place] [address] Open ? Closes 4:30?PM Phone: [phone] Appointments: [email or web address]  Schedule fasting lab appointment. Fast for 10 hours.   This is a list of the screening recommended for you and due dates:  Health Maintenance  Topic Date Due   Zoster (Shingles) Vaccine (1 of 2) Never done   COVID-19 Vaccine (6 - Pfizer risk 2024-25 season) 02/02/2024   Flu Shot  05/10/2024   Medicare Annual Wellness Visit  12/04/2024   Mammogram  01/29/2025   Colon Cancer Screening  10/18/2028   DTaP/Tdap/Td vaccine (2 - Td or Tdap) 02/19/2031   Pneumonia Vaccine  Completed   DEXA scan (bone density measurement)  Completed   Hepatitis C Screening  Completed   HPV Vaccine  Aged Out      If you have any questions or concerns, please do not hesitate to call the office at [phone].  I look forward to our next visit and until then take care and stay safe.  Regards,   Dana Allan, MD   [facility]

## 2024-01-09 NOTE — Progress Notes (Signed)
 SUBJECTIVE:   Chief Complaint  Patient presents with   yearly follow up   HPI Presents for follow up chronic disease management  Discussed the use of AI scribe software for clinical note transcription with the patient, who gave verbal consent to proceed.  History of Present Illness Kim Mitchell is a 68 year old female who presents for a routine follow-up visit.  She has been experiencing ongoing sleep disturbances due to pain in her left shoulder and back for approximately six months to a year. The pain is described as a nagging, toothache-like sensation with occasional sharp episodes, primarily occurring at night. It is localized to the trapezius muscle area. No numbness or tingling in the arm or neck, and she maintains a good range of motion. Exercise twice a week does not exacerbate the pain. She finds temporary relief with Tylenol and a topical rub.  She experiences painful intercourse since her hysterectomy, using a water-based lubricant for relief. She previously used an estrogen ring, which was beneficial, but discontinued it due to changes in her medical care. She has not tried pelvic floor therapy. No history of breast or cervical cancer and has no uterus due to the hysterectomy.  She uses a nasal spray and Xyzal, with a recent need for refills. No chest pain or shortness of breath. She does not smoke.   Review of Systems - Negative except listed above   PERTINENT PMH / PSH: As above  OBJECTIVE:  BP 112/76   Pulse 85   Temp 98.4 F (36.9 C)   Resp 20   Ht 5\' 5"  (1.651 m)   Wt 157 lb (71.2 kg)   SpO2 99%   BMI 26.13 kg/m    Physical Exam Vitals reviewed.  Constitutional:      General: She is not in acute distress.    Appearance: She is not ill-appearing.  HENT:     Head: Normocephalic.     Right Ear: Tympanic membrane, ear canal and external ear normal.     Left Ear: Tympanic membrane, ear canal and external ear normal.     Nose: Nose normal.      Mouth/Throat:     Mouth: Mucous membranes are moist.  Eyes:     Extraocular Movements: Extraocular movements intact.     Conjunctiva/sclera: Conjunctivae normal.     Pupils: Pupils are equal, round, and reactive to light.  Neck:     Vascular: No carotid bruit.  Cardiovascular:     Rate and Rhythm: Normal rate and regular rhythm.     Pulses: Normal pulses.     Heart sounds: Normal heart sounds.  Pulmonary:     Effort: Pulmonary effort is normal.     Breath sounds: Normal breath sounds.  Abdominal:     General: Bowel sounds are normal. There is no distension.     Palpations: Abdomen is soft.     Tenderness: There is no abdominal tenderness. There is no right CVA tenderness, left CVA tenderness, guarding or rebound.  Musculoskeletal:        General: Normal range of motion.     Cervical back: Normal range of motion.     Right lower leg: No edema.     Left lower leg: No edema.  Lymphadenopathy:     Cervical: No cervical adenopathy.  Skin:    Capillary Refill: Capillary refill takes less than 2 seconds.  Neurological:     General: No focal deficit present.     Mental Status: She  is alert and oriented to person, place, and time. Mental status is at baseline.     Motor: No weakness.  Psychiatric:        Mood and Affect: Mood normal.        Behavior: Behavior normal.        Thought Content: Thought content normal.        Judgment: Judgment normal.           01/09/2024    1:30 PM 12/05/2023    8:14 AM 11/18/2022   11:12 AM 11/14/2022    9:31 AM 10/19/2022    3:07 PM  Depression screen PHQ 2/9  Decreased Interest 0 0 0 0 0  Down, Depressed, Hopeless 0 0 0 0 0  PHQ - 2 Score 0 0 0 0 0  Altered sleeping 1 0 0 1 1  Tired, decreased energy 1 0 0 0 1  Change in appetite 0 0 0 0 0  Feeling bad or failure about yourself  0 0 0 0 0  Trouble concentrating 0 0 0 0 0  Moving slowly or fidgety/restless 0 0 0 0 0  Suicidal thoughts 0 0 0 0 0  PHQ-9 Score 2 0 0 1 2  Difficult doing  work/chores Not difficult at all Not difficult at all Not difficult at all Not difficult at all Not difficult at all      01/09/2024    1:30 PM 11/14/2022    9:31 AM 10/19/2022    3:07 PM 02/12/2021    1:44 PM  GAD 7 : Generalized Anxiety Score  Nervous, Anxious, on Edge 0 0 0 0  Control/stop worrying 0 0 0 0  Worry too much - different things 0 0 0 0  Trouble relaxing 0 0 0 0  Restless 0 0 0 0  Easily annoyed or irritable 0 0 0 0  Afraid - awful might happen 0 0 0 0  Total GAD 7 Score 0 0 0 0  Anxiety Difficulty Not difficult at all Not difficult at all Not difficult at all Not difficult at all    ASSESSMENT/PLAN:  Muscle strain Assessment & Plan: Chronic left shoulder pain, primarily nocturnal, described as a nagging, sharp pain in the trapezius muscle area. No numbness or tingling, with good range of motion. Likely muscular in origin, possibly due to a chronic injury or muscle strain. Differential includes cervical arthritis, but less likely due to good neck mobility and absence of nerve symptoms. - Prescribe muscle relaxant (Zanaflex) - Prescribe Relafen 500 mg BID prn - Advise heat and ice therapy - Consider neck imaging if no improvement in 2-4 weeks - Discuss potential for physical therapy, including dry needling and massage therapy  Orders: -     Nabumetone; Take 1 tablet (500 mg total) by mouth daily.  Dispense: 30 tablet; Refill: 0 -     tiZANidine HCl; Take 0.5 tablets (2 mg total) by mouth every 6 (six) hours as needed for muscle spasms.  Dispense: 60 tablet; Refill: 0 -     Omeprazole; Take 1 capsule (20 mg total) by mouth daily.  Dispense: 60 capsule; Refill: 0  Abnormal glucose -     Hemoglobin A1c; Future  Lipid screening -     Lipid panel; Future -     Comprehensive metabolic panel with GFR; Future  Vitamin B 12 deficiency -     Vitamin B12; Future  Vitamin D deficiency -     VITAMIN D 25 Hydroxy (Vit-D Deficiency,  Fractures); Future  Dyspareunia in  female Assessment & Plan: Dyspareunia since hysterectomy, primarily during insertion. Limited relief with water-based lubricant. No breast or cervical cancer. Concerns about hormonal therapy side effects, but previous low-dose estrogen ring was beneficial. Discussed hormonal and non-hormonal options, including Veozah, which requires liver function monitoring due to potential liver enzyme elevation. Non-hormonal options like pelvic floor therapy and Delrae Rend MD consultation for hormonal management were also discussed. - Provide information on hormonal and non-hormonal treatment options, including Veozah and estrogen ring - Check insurance coverage for hormonal treatments - Consider referral to [facility] MD for hormonal therapy consultation - Discuss pelvic floor therapy as a non-hormonal option - Provide information on [facility] MD for further hormonal management  Orders: -     Ambulatory referral to Physical Therapy  Iron deficiency anemia, unspecified iron deficiency anemia type -     CBC with Differential/Platelet; Future -     Iron, TIBC and Ferritin Panel; Future  Seasonal allergies -     Fluticasone Propionate; Place 2 sprays into both nostrils daily.  Dispense: 18.2 mL; Refill: 11  Other neutropenia (HCC) -     CBC with Differential/Platelet; Future -     Peripheral Blood Smear Review; Future     PDMP reviewed  Return if symptoms worsen or fail to improve, for PCP.  Dana Allan, MD

## 2024-01-10 ENCOUNTER — Other Ambulatory Visit (INDEPENDENT_AMBULATORY_CARE_PROVIDER_SITE_OTHER)

## 2024-01-10 DIAGNOSIS — E559 Vitamin D deficiency, unspecified: Secondary | ICD-10-CM

## 2024-01-10 DIAGNOSIS — E538 Deficiency of other specified B group vitamins: Secondary | ICD-10-CM | POA: Diagnosis not present

## 2024-01-10 DIAGNOSIS — Z13220 Encounter for screening for lipoid disorders: Secondary | ICD-10-CM

## 2024-01-10 DIAGNOSIS — D509 Iron deficiency anemia, unspecified: Secondary | ICD-10-CM | POA: Diagnosis not present

## 2024-01-10 DIAGNOSIS — R7309 Other abnormal glucose: Secondary | ICD-10-CM | POA: Diagnosis not present

## 2024-01-10 LAB — COMPREHENSIVE METABOLIC PANEL WITH GFR
ALT: 14 U/L (ref 0–35)
AST: 19 U/L (ref 0–37)
Albumin: 4.4 g/dL (ref 3.5–5.2)
Alkaline Phosphatase: 57 U/L (ref 39–117)
BUN: 14 mg/dL (ref 6–23)
CO2: 28 meq/L (ref 19–32)
Calcium: 9.5 mg/dL (ref 8.4–10.5)
Chloride: 106 meq/L (ref 96–112)
Creatinine, Ser: 0.81 mg/dL (ref 0.40–1.20)
GFR: 75.18 mL/min (ref >60.00)
Glucose, Bld: 83 mg/dL (ref 70–99)
Potassium: 4.3 meq/L (ref 3.5–5.1)
Sodium: 142 meq/L (ref 135–145)
Total Bilirubin: 0.4 mg/dL (ref 0.2–1.2)
Total Protein: 7.3 g/dL (ref 6.0–8.3)

## 2024-01-10 LAB — CBC WITH DIFFERENTIAL/PLATELET
Basophils Absolute: 0 10*3/uL (ref 0.0–0.1)
Basophils Relative: 1 % (ref 0.0–3.0)
Eosinophils Absolute: 0.3 10*3/uL (ref 0.0–0.7)
Eosinophils Relative: 9.9 % — ABNORMAL HIGH (ref 0.0–5.0)
HCT: 39.5 % (ref 36.0–46.0)
Hemoglobin: 12.9 g/dL (ref 12.0–15.0)
Lymphocytes Relative: 55.1 % — ABNORMAL HIGH (ref 12.0–46.0)
Lymphs Abs: 1.6 10*3/uL (ref 0.7–4.0)
MCHC: 32.5 g/dL (ref 30.0–36.0)
MCV: 74.8 fl — ABNORMAL LOW (ref 78.0–100.0)
Monocytes Absolute: 0.3 10*3/uL (ref 0.1–1.0)
Monocytes Relative: 11.6 % (ref 3.0–12.0)
Neutro Abs: 0.7 10*3/uL — ABNORMAL LOW (ref 1.4–7.7)
Neutrophils Relative %: 22.4 % — ABNORMAL LOW (ref 43.0–77.0)
Platelets: 232 10*3/uL (ref 150.0–400.0)
RBC: 5.29 Mil/uL — ABNORMAL HIGH (ref 3.87–5.11)
RDW: 15.4 % (ref 11.5–15.5)
WBC: 2.9 10*3/uL — ABNORMAL LOW (ref 4.0–10.5)

## 2024-01-10 LAB — HEMOGLOBIN A1C: Hgb A1c MFr Bld: 5.9 % (ref 4.6–6.5)

## 2024-01-10 LAB — LIPID PANEL
Cholesterol: 147 mg/dL (ref 0–200)
HDL: 52.3 mg/dL (ref >39.00)
LDL Cholesterol: 85 mg/dL (ref 0–99)
NonHDL: 95.09
Total CHOL/HDL Ratio: 3
Triglycerides: 52 mg/dL (ref 0.0–149.0)
VLDL: 10.4 mg/dL (ref 0.0–40.0)

## 2024-01-10 LAB — VITAMIN B12: Vitamin B-12: 763 pg/mL (ref 211–911)

## 2024-01-10 LAB — VITAMIN D 25 HYDROXY (VIT D DEFICIENCY, FRACTURES): VITD: 38.12 ng/mL (ref 30.00–100.00)

## 2024-01-11 LAB — IRON,TIBC AND FERRITIN PANEL
%SAT: 42 % (ref 16–45)
Ferritin: 148 ng/mL (ref 16–288)
Iron: 114 ug/dL (ref 45–160)
TIBC: 270 ug/dL (ref 250–450)

## 2024-01-14 ENCOUNTER — Encounter: Payer: Self-pay | Admitting: Family Medicine

## 2024-01-14 DIAGNOSIS — N941 Unspecified dyspareunia: Secondary | ICD-10-CM | POA: Insufficient documentation

## 2024-01-14 DIAGNOSIS — T148XXA Other injury of unspecified body region, initial encounter: Secondary | ICD-10-CM | POA: Insufficient documentation

## 2024-01-14 DIAGNOSIS — D509 Iron deficiency anemia, unspecified: Secondary | ICD-10-CM | POA: Insufficient documentation

## 2024-01-14 DIAGNOSIS — E538 Deficiency of other specified B group vitamins: Secondary | ICD-10-CM | POA: Insufficient documentation

## 2024-01-14 DIAGNOSIS — R7309 Other abnormal glucose: Secondary | ICD-10-CM | POA: Insufficient documentation

## 2024-01-14 DIAGNOSIS — Z13220 Encounter for screening for lipoid disorders: Secondary | ICD-10-CM | POA: Insufficient documentation

## 2024-01-14 MED ORDER — FLUTICASONE PROPIONATE 50 MCG/ACT NA SUSP: 2.0000 | Freq: Every day | NASAL | 11 refills | Status: DC

## 2024-01-14 NOTE — Assessment & Plan Note (Addendum)
 Chronic left shoulder pain, primarily nocturnal, described as a nagging, sharp pain in the trapezius muscle area. No numbness or tingling, with good range of motion. Likely muscular in origin, possibly due to a chronic injury or muscle strain. Differential includes cervical arthritis, but less likely due to good neck mobility and absence of nerve symptoms. - Prescribe muscle relaxant (Zanaflex) - Prescribe Relafen 500 mg BID prn - Advise heat and ice therapy - Consider neck imaging if no improvement in 2-4 weeks - Discuss potential for physical therapy, including dry needling and massage therapy

## 2024-01-14 NOTE — Assessment & Plan Note (Signed)
 Dyspareunia since hysterectomy, primarily during insertion. Limited relief with water-based lubricant. No breast or cervical cancer. Concerns about hormonal therapy side effects, but previous low-dose estrogen ring was beneficial. Discussed hormonal and non-hormonal options, including Veozah, which requires liver function monitoring due to potential liver enzyme elevation. Non-hormonal options like pelvic floor therapy and Delrae Rend MD consultation for hormonal management were also discussed. - Provide information on hormonal and non-hormonal treatment options, including Veozah and estrogen ring - Check insurance coverage for hormonal treatments - Consider referral to [facility] MD for hormonal therapy consultation - Discuss pelvic floor therapy as a non-hormonal option - Provide information on Delrae Rend MD for further hormonal management

## 2024-01-15 ENCOUNTER — Other Ambulatory Visit: Payer: Self-pay

## 2024-01-15 DIAGNOSIS — D729 Disorder of white blood cells, unspecified: Secondary | ICD-10-CM

## 2024-01-24 ENCOUNTER — Encounter: Payer: Self-pay | Admitting: Oncology

## 2024-01-24 ENCOUNTER — Inpatient Hospital Stay: Attending: Oncology | Admitting: Oncology

## 2024-01-24 ENCOUNTER — Inpatient Hospital Stay

## 2024-01-24 VITALS — BP 128/92 | HR 81 | Temp 97.8°F | Resp 16 | Wt 158.0 lb

## 2024-01-24 DIAGNOSIS — Z79899 Other long term (current) drug therapy: Secondary | ICD-10-CM | POA: Diagnosis not present

## 2024-01-24 DIAGNOSIS — Z808 Family history of malignant neoplasm of other organs or systems: Secondary | ICD-10-CM | POA: Diagnosis not present

## 2024-01-24 DIAGNOSIS — D708 Other neutropenia: Secondary | ICD-10-CM

## 2024-01-24 DIAGNOSIS — K219 Gastro-esophageal reflux disease without esophagitis: Secondary | ICD-10-CM | POA: Insufficient documentation

## 2024-01-24 DIAGNOSIS — Z803 Family history of malignant neoplasm of breast: Secondary | ICD-10-CM | POA: Insufficient documentation

## 2024-01-24 DIAGNOSIS — D709 Neutropenia, unspecified: Secondary | ICD-10-CM | POA: Insufficient documentation

## 2024-01-24 DIAGNOSIS — Z8049 Family history of malignant neoplasm of other genital organs: Secondary | ICD-10-CM | POA: Diagnosis not present

## 2024-01-24 DIAGNOSIS — Z801 Family history of malignant neoplasm of trachea, bronchus and lung: Secondary | ICD-10-CM | POA: Insufficient documentation

## 2024-01-24 DIAGNOSIS — R718 Other abnormality of red blood cells: Secondary | ICD-10-CM

## 2024-01-24 DIAGNOSIS — Z8249 Family history of ischemic heart disease and other diseases of the circulatory system: Secondary | ICD-10-CM | POA: Insufficient documentation

## 2024-01-24 NOTE — Progress Notes (Signed)
 Hematology/Oncology Consult note [facility]  [phone] [phone]  Patient Care Team: Dana Allan, MD as PCP - General (Family Medicine) Creig Hines, MD as Consulting Physician (Hematology and Oncology)   Name of the patient: Kim Mitchell  [id number]  04-18-1956   Date of visit: 01/24/24  Diagnosis-leukopenia/neutropenia   Chief complaint/ Reason for visit-reestablish follow-up for leukopenia   Heme/Onc history: Patient is a 68 year old female seen in the past for leukopenia.  Her past medical history significant for GERD and irritable bowel syndrome.Results of blood work from 01/24/2022 showed white cell count of 3.5, H&H of 12.9/40.1 with an MCV of 73And a platelet count of 237.  Ferritin B12 folate normal.  Smear review unremarkable.  Conservative management for leukopenia was recommended at that time.  Interval history-she feels well overall.  Denies any changes in her appetite or weight.  No recurrent infections.  No known history of autoimmune disorders.  ECOG PS- 1 Pain scale- 0 Opioid associated constipation- no  Review of systems- Review of Systems  Constitutional:  Negative for chills, fever, malaise/fatigue and weight loss.  HENT:  Negative for congestion, ear discharge and nosebleeds.   Eyes:  Negative for blurred vision.  Respiratory:  Negative for cough, hemoptysis, sputum production, shortness of breath and wheezing.   Cardiovascular:  Negative for chest pain, palpitations, orthopnea and claudication.  Gastrointestinal:  Negative for abdominal pain, blood in stool, constipation, diarrhea, heartburn, melena, nausea and vomiting.  Genitourinary:  Negative for dysuria, flank pain, frequency, hematuria and urgency.  Musculoskeletal:  Negative for back pain, joint pain and myalgias.  Skin:  Negative for rash.  Neurological:  Negative for dizziness, tingling, focal weakness, seizures, weakness and headaches.   Endo/Heme/Allergies:  Does not bruise/bleed easily.  Psychiatric/Behavioral:  Negative for depression and suicidal ideas. The patient does not have insomnia.       Allergies  Allergen Reactions   Sulfa Antibiotics Swelling    Facial Swelling   Demerol [Meperidine Hcl] Rash     Past Medical History:  Diagnosis Date   Acute left ankle pain 12/28/2020   Chronic insomnia 02/16/2022   COVID-19    Encounter for administration of vaccine 10/27/2022   Fibroids    confrimed from medical records   GERD (gastroesophageal reflux disease)    H. pylori infection    Heart murmur    IBS (irritable bowel syndrome)    Kidney stone    2004   Leukocytes in urine 12/16/2019   Leukopenia    Screening for blood or protein in urine 12/16/2019   Sickle cell trait (HCC)    Soft tissue disorder of ankle 12/28/2020   Swelling of joint of hand 12/16/2019   Vaginal dryness, menopausal 12/16/2019     Past Surgical History:  Procedure Laterality Date   BREAST BIOPSY Right 01/29/2021   Affirm bx-"X" clip-Benign   BREAST BIOPSY Right 03/02/2021   Sffirm bx- coil clip, path pending    BREAST CYST EXCISION Right 50 years ago ~1970   neg   BREAST SURGERY     LAPAROSCOPIC ASSISTED VAGINAL HYSTERECTOMY  11/18/2009   partial hysterectomy only ovaries left no h/o h/o abnormal pap    LITHOTRIPSY Right    2004   TUBAL LIGATION     1985    Social History   Socioeconomic History   Marital status: Married    Spouse name: Not on file   Number of children: Not on file   Years of  education: Not on file   Highest education level: Not on file  Occupational History   Not on file  Tobacco Use   Smoking status: Never   Smokeless tobacco: Never  Substance and Sexual Activity   Alcohol use: Not Currently   Drug use: Never   Sexual activity: Not Currently  Other Topics Concern   Not on file  Social History Narrative   7 siblings, 4 living as of 02/12/21    Retired Arts administrator    2 female kids 45,  37 02/12/21    [place] up [place] to [place] [place] 2nd grade relocated here [employer] eduation   Social Drivers of Health   Financial Resource Strain: Low Risk  (12/04/2023)   Overall Financial Resource Strain (CARDIA)    Difficulty of Paying Living Expenses: Not hard at all  Food Insecurity: No Food Insecurity (12/05/2023)   Hunger Vital Sign    Worried About Running Out of Food in the Last Year: Never true    Ran Out of Food in the Last Year: Never true  Recent Concern: Food Insecurity - Food Insecurity Present (11/17/2023)   Hunger Vital Sign    Worried About Running Out of Food in the Last Year: Never true    Ran Out of Food in the Last Year: Sometimes true  Transportation Needs: No Transportation Needs (12/04/2023)   PRAPARE - Administrator, Civil Service (Medical): No    Lack of Transportation (Non-Medical): No  Physical Activity: Sufficiently Active (12/05/2023)   Exercise Vital Sign    Days of Exercise per Week: 4 days    Minutes of Exercise per Session: 40 min  Stress: No Stress Concern Present (12/05/2023)   [employer] of Occupational Health - Occupational Stress Questionnaire    Feeling of Stress : Only a little  Social Connections: Socially Integrated (12/04/2023)   Social Connection and Isolation Panel [NHANES]    Frequency of Communication with Friends and Family: More than three times a week    Frequency of Social Gatherings with Friends and Family: Once a week    Attends Religious Services: More than 4 times per year    Active Member of [employer] or Organizations: Yes    Attends Engineer, structural: More than 4 times per year    Marital Status: Married  Catering manager Violence: Not At Risk (12/05/2023)   Humiliation, Afraid, Rape, and Kick questionnaire    Fear of Current or Ex-Partner: No    Emotionally Abused: No    Physically Abused: No    Sexually Abused: No    Family History  Problem Relation Age of Onset   Cancer Sister        jaw     Heart attack Brother    Cancer Brother        GU cancer   Lung cancer Maternal Aunt    Lung cancer Maternal Aunt    Hypertension Maternal Grandmother    Cancer Maternal Grandfather        ?type   Breast cancer Cousin    Uterine cancer Cousin    Hypertension Other        siblings      Current Outpatient Medications:    acetaminophen (TYLENOL) 500 MG tablet, Take 500 mg by mouth every 6 (six) hours as needed., Disp: , Rfl:    Calcium Carbonate-Vitamin D 600-400 MG-UNIT tablet, Take 2 tablets by mouth daily., Disp: , Rfl:    docusate sodium (COLACE) 100  MG capsule, Take 100 mg by mouth as needed for mild constipation., Disp: , Rfl:    fluticasone (FLONASE) 50 MCG/ACT nasal spray, Place 2 sprays into both nostrils daily., Disp: 18.2 mL, Rfl: 11   Iron, Ferrous Sulfate, 325 (65 Fe) MG TABS, Take 1 tablet by mouth every other day. (Patient not taking: Reported on 01/09/2024), Disp: , Rfl:    levocetirizine (XYZAL) 5 MG tablet, Take 1 tablet (5 mg total) by mouth every evening., Disp: 90 tablet, Rfl: 3   Melatonin 10 MG CAPS, Take 10 mg by mouth., Disp: , Rfl:    Multiple Vitamins-Minerals (CENTRUM SILVER PO), Take 1 tablet by mouth daily., Disp: , Rfl:    nabumetone (RELAFEN) 500 MG tablet, Take 1 tablet (500 mg total) by mouth daily., Disp: 30 tablet, Rfl: 0   omeprazole (PRILOSEC) 20 MG capsule, Take 1 capsule (20 mg total) by mouth daily., Disp: 60 capsule, Rfl: 0   tiZANidine (ZANAFLEX) 4 MG tablet, Take 0.5 tablets (2 mg total) by mouth every 6 (six) hours as needed for muscle spasms., Disp: 60 tablet, Rfl: 0  Physical exam: There were no vitals filed for this visit. Physical Exam Cardiovascular:     Rate and Rhythm: Normal rate and regular rhythm.     Heart sounds: Normal heart sounds.  Pulmonary:     Effort: Pulmonary effort is normal.     Breath sounds: Normal breath sounds.  Abdominal:     General: Bowel sounds are normal.     Palpations: Abdomen is soft.     Comments: No  palpable hepatosplenomegaly  Lymphadenopathy:     Comments: No palpable cervical, supraclavicular, axillary or inguinal adenopathy    Skin:    General: Skin is warm and dry.  Neurological:     Mental Status: She is alert and oriented to person, place, and time.      I have personally reviewed labs listed below:    Latest Ref Rng & Units 01/10/2024   10:18 AM  CMP  Glucose 70 - 99 mg/dL 83   BUN 6 - 23 mg/dL 14   Creatinine 1.61 - 1.20 mg/dL 0.96   Sodium 045 - 409 mEq/L 142   Potassium 3.5 - 5.1 mEq/L 4.3   Chloride 96 - 112 mEq/L 106   CO2 19 - 32 mEq/L 28   Calcium 8.4 - 10.5 mg/dL 9.5   Total Protein 6.0 - 8.3 g/dL 7.3   Total Bilirubin 0.2 - 1.2 mg/dL 0.4   Alkaline Phos 39 - 117 U/L 57   AST 0 - 37 U/L 19   ALT 0 - 35 U/L 14       Latest Ref Rng & Units 01/10/2024   10:18 AM  CBC  WBC 4.0 - 10.5 K/uL 2.9   Hemoglobin 12.0 - 15.0 g/dL 81.1   Hematocrit 91.4 - 46.0 % 39.5   Platelets 150.0 - 400.0 K/uL 232.0      Assessment and plan- Patient is a 68 y.o. female referred for neutropenia  Patient has had longstanding leukopenia/neutropenia over the last 3 to 4 years but her ANC had remained more than 1 during those occasions.  More recently her white count has drifted down to 2.9 with an ANC of 0.7.  She denies any recurrent infections.  I will be doing further workup for her neutropenia today including CBC with differential, ANA comprehensive panel, rheumatoid factor, flow cytometry and T-cell gene rearrangement assay as well as B12 folate and smear review.  I  will see the patient back in 2 weeks time to discuss the results of blood work.  If these tests are unremarkable I will consider proceeding with a bone marrow biopsy given worsening neutropenia.  Patient's hemoglobin is normal and she has had longstanding microcytosis.  I am checking hemoglobin electrophoresis.   Visit Diagnosis 1. Other neutropenia (HCC)   2. Microcytosis      Dr. Seretha Dance, MD, MPH [facility]  at [facility] [phone] 01/24/2024 8:57 AM

## 2024-01-24 NOTE — Addendum Note (Signed)
 Addended by: Marilyn Shropshire on: 01/24/2024 10:40 AM   Modules accepted: Orders

## 2024-01-26 ENCOUNTER — Inpatient Hospital Stay

## 2024-01-26 DIAGNOSIS — R718 Other abnormality of red blood cells: Secondary | ICD-10-CM

## 2024-01-26 DIAGNOSIS — D709 Neutropenia, unspecified: Secondary | ICD-10-CM | POA: Diagnosis not present

## 2024-01-26 DIAGNOSIS — D708 Other neutropenia: Secondary | ICD-10-CM

## 2024-01-26 LAB — CBC WITH DIFFERENTIAL/PLATELET
Abs Immature Granulocytes: 0 10*3/uL (ref 0.00–0.07)
Basophils Absolute: 0 10*3/uL (ref 0.0–0.1)
Basophils Relative: 1 %
Eosinophils Absolute: 0.3 10*3/uL (ref 0.0–0.5)
Eosinophils Relative: 9 %
HCT: 38.3 % (ref 36.0–46.0)
Hemoglobin: 12.4 g/dL (ref 12.0–15.0)
Immature Granulocytes: 0 %
Lymphocytes Relative: 54 %
Lymphs Abs: 1.7 10*3/uL (ref 0.7–4.0)
MCH: 24.1 pg — ABNORMAL LOW (ref 26.0–34.0)
MCHC: 32.4 g/dL (ref 30.0–36.0)
MCV: 74.5 fL — ABNORMAL LOW (ref 80.0–100.0)
Monocytes Absolute: 0.3 10*3/uL (ref 0.1–1.0)
Monocytes Relative: 9 %
Neutro Abs: 0.9 10*3/uL — ABNORMAL LOW (ref 1.7–7.7)
Neutrophils Relative %: 27 %
Platelets: 217 10*3/uL (ref 150–400)
RBC: 5.14 MIL/uL — ABNORMAL HIGH (ref 3.87–5.11)
RDW: 15.2 % (ref 11.5–15.5)
WBC: 3.1 10*3/uL — ABNORMAL LOW (ref 4.0–10.5)
nRBC: 0 % (ref 0.0–0.2)

## 2024-01-26 LAB — TECHNOLOGIST SMEAR REVIEW
Plt Morphology: NORMAL
RBC MORPHOLOGY: NORMAL
WBC MORPHOLOGY: NORMAL

## 2024-01-26 LAB — FOLATE: Folate: 23 ng/mL (ref >5.9)

## 2024-01-27 LAB — ANA COMPREHENSIVE PANEL

## 2024-01-27 LAB — VITAMIN B12: Vitamin B-12: 538 pg/mL (ref 180–914)

## 2024-01-28 LAB — RHEUMATOID FACTOR: Rheumatoid fact SerPl-aCnc: <10 [IU]/mL (ref <14.0)

## 2024-01-29 ENCOUNTER — Other Ambulatory Visit

## 2024-01-31 ENCOUNTER — Telehealth: Payer: Self-pay

## 2024-01-31 DIAGNOSIS — D708 Other neutropenia: Secondary | ICD-10-CM

## 2024-01-31 LAB — COMP PANEL: LEUKEMIA/LYMPHOMA

## 2024-01-31 LAB — HGB SOLUBILITY: Hgb Solubility: POSITIVE — AB

## 2024-01-31 LAB — HGB FRACTIONATION CASCADE
Hgb A2: 3.6 % — ABNORMAL HIGH (ref 1.8–3.2)
Hgb A: 64 % — ABNORMAL LOW (ref 96.4–98.8)
Hgb F: 0 % (ref 0.0–2.0)
Hgb S: 32.4 % — ABNORMAL HIGH

## 2024-01-31 NOTE — Telephone Encounter (Signed)
 Per Dr. Randy Buttery "Blood work so far has not revealed a cause for her neutropenia. Please ask her if she would be willing to proceed with bone marrow biopsy like we had discussed. If yes please schedule it and my f/u should be a week after biopsy ".  Outbound call to patient; informed of above.  Patient is willing to move forward with bone marrow biopsy; order placed in system and message sent to Jerona Mooring to help coordinate.  Informed patient I will be following up shortly with a tentative date / time for the biopsy.  Patient has no additional questions at this time.

## 2024-01-31 NOTE — Telephone Encounter (Signed)
-----   Message from Kim Mitchell sent at 01/31/2024 11:40 AM EDT ----- Blood work so far has not revealed a cause for her neutropenia. Please ask her if she would be willing to proceed with bone marrow biopsy like we had discussed. If yes please schedule it and my f/u should be a week after biopsy

## 2024-01-31 NOTE — Telephone Encounter (Signed)
 Per Leary Provencal "The next available is Thurs 5/8 at 8:30a an arrive at 7:30a. Just let me know if that works for the patient. Thanks".  Outbound call to patient; informed of above.  Patient agreed to the above date / time for biopsy.  Patient is currently scheduled for a follow up appointment w/ Dr. Randy Buttery on 02/14/24 at 3PM; will follow up with provider and scheduling to see if follow up OV needs to be rescheduled after the biopsy.  Informed patient the scheduling team may be in touch shortly to coordinate a possible schedule change to the follow up appointment.  Patient asked if upcoming appointment would be visible in mychart; I confirmed indeed it would.

## 2024-01-31 NOTE — Telephone Encounter (Signed)
 Follow up appointment with Dr. Randy Buttery rescheduled 02/26/24 at 3:15PM.

## 2024-02-01 ENCOUNTER — Ambulatory Visit
Admission: RE | Admit: 2024-02-01 | Discharge: 2024-02-01 | Disposition: A | Discharge status: Home / Self Care | Source: Ambulatory Visit | Attending: Family Medicine | Admitting: Family Medicine

## 2024-02-01 DIAGNOSIS — Z1231 Encounter for screening mammogram for malignant neoplasm of breast: Secondary | ICD-10-CM | POA: Diagnosis present

## 2024-02-02 LAB — MISC LABCORP TEST (SEND OUT): Labcorp test code: 481080

## 2024-02-03 ENCOUNTER — Other Ambulatory Visit: Payer: Self-pay | Admitting: Family Medicine

## 2024-02-03 DIAGNOSIS — J302 Other seasonal allergic rhinitis: Secondary | ICD-10-CM

## 2024-02-07 ENCOUNTER — Other Ambulatory Visit: Payer: Self-pay | Admitting: Family Medicine

## 2024-02-07 DIAGNOSIS — T148XXA Other injury of unspecified body region, initial encounter: Secondary | ICD-10-CM

## 2024-02-09 NOTE — Telephone Encounter (Signed)
 Lvm for pt to give office a call back in regards to refills. Okay to relay questions

## 2024-02-13 ENCOUNTER — Other Ambulatory Visit: Payer: Self-pay | Admitting: Radiology

## 2024-02-13 DIAGNOSIS — D708 Other neutropenia: Secondary | ICD-10-CM

## 2024-02-13 NOTE — H&P (Signed)
 Chief Complaint: Chronic neutropenia - IR consulted for image guided bone marrow biopsy  Referring Provider(s): Avonne Boettcher, MD   Supervising Physician: Erica Hau  Patient Status: [facility] - Out-pt  History of Present Illness: Kim Mitchell is a 68 y.o. female with pmhx of GERD, IBS, and chronic neutropenia. Seen by Dr. Randy Buttery with oncology for this initially 3-4 years ago, at visit in April 2023, pt had normal smear and normal ferritin, B12, and folate. Plan at that time plan was to treat it conservatively. Pt followed up with Dr. Randy Buttery again 01/24/24 and she repeated lab work with plan for bone marrow biopsy if no etiology found for neutropenia. Pt now presenting to IR for image guided bone marrow biopsy.  *** Patient is Full Code  Past Medical History:  Diagnosis Date   Acute left ankle pain 12/28/2020   Chronic insomnia 02/16/2022   COVID-19    Encounter for administration of vaccine 10/27/2022   Fibroids    confrimed from medical records   GERD (gastroesophageal reflux disease)    H. pylori infection    Heart murmur    IBS (irritable bowel syndrome)    Kidney stone    2004   Leukocytes in urine 12/16/2019   Leukopenia    Screening for blood or protein in urine 12/16/2019   Sickle cell trait (HCC)    Soft tissue disorder of ankle 12/28/2020   Swelling of joint of hand 12/16/2019   Vaginal dryness, menopausal 12/16/2019    Past Surgical History:  Procedure Laterality Date   BREAST BIOPSY Right 01/29/2021   Affirm bx-"X" clip-Benign   BREAST BIOPSY Right 03/02/2021   Sffirm bx- coil clip, path pending    BREAST CYST EXCISION Right 50 years ago ~1970   neg   BREAST SURGERY     LAPAROSCOPIC ASSISTED VAGINAL HYSTERECTOMY  11/18/2009   partial hysterectomy only ovaries left no h/o h/o abnormal pap    LITHOTRIPSY Right    2004   TUBAL LIGATION     1985    Allergies: Sulfa antibiotics and Demerol [meperidine hcl]  Medications: Prior to Admission  medications   Medication Sig Start Date End Date Taking? Authorizing Provider  acetaminophen (TYLENOL) 500 MG tablet Take 500 mg by mouth every 6 (six) hours as needed.    [provider]  Calcium Carbonate-Vitamin D  600-400 MG-UNIT tablet Take 2 tablets by mouth daily.    [provider]  docusate sodium (COLACE) 100 MG capsule Take 100 mg by mouth as needed for mild constipation.    [provider]  fluticasone  (FLONASE ) 50 MCG/ACT nasal spray Place 2 sprays into both nostrils daily. 01/14/24   Walsh, Tanya, MD  Iron, Ferrous Sulfate, 325 (65 Fe) MG TABS Take 1 tablet by mouth every other day. Patient not taking: Reported on 12/05/2023 12/08/20   [provider]  levocetirizine (XYZAL ) 5 MG tablet TAKE 1 TABLET BY MOUTH EVERY DAY IN THE EVENING 02/05/24   Valli Gaw, MD  Melatonin 10 MG CAPS Take 10 mg by mouth.    [provider]  Multiple Vitamins-Minerals (CENTRUM SILVER PO) Take 1 tablet by mouth daily.    [provider]  omeprazole  (PRILOSEC) 20 MG capsule Take 1 capsule (20 mg total) by mouth daily. 01/09/24   Valli Gaw, MD  tiZANidine  (ZANAFLEX ) 4 MG tablet Take 0.5 tablets (2 mg total) by mouth every 6 (six) hours as needed for muscle spasms. 01/09/24   Valli Gaw, MD  Family History  Problem Relation Age of Onset   Cancer Sister        jaw    Heart attack Brother    Cancer Brother        GU cancer   Lung cancer Maternal Aunt    Lung cancer Maternal Aunt    Hypertension Maternal Grandmother    Cancer Maternal Grandfather        ?type   Breast cancer Cousin    Uterine cancer Cousin    Hypertension Other        siblings     Social History   Socioeconomic History   Marital status: Married    Spouse name: Not on file   Number of children: Not on file   Years of education: Not on file   Highest education level: Not on file  Occupational History   Not on file  Tobacco Use   Smoking status: Never   Smokeless  tobacco: Never  Substance and Sexual Activity   Alcohol use: Not Currently   Drug use: Never   Sexual activity: Not Currently  Other Topics Concern   Not on file  Social History Narrative   7 siblings, 4 living as of 02/12/21    Retired Arts administrator    2 female kids 45, 37 02/12/21    [place]rew up [place] moved to [place]  2nd grade relocated here [employer] eduation   Social Drivers of Health   Financial Resource Strain: Low Risk  (12/04/2023)   Overall Financial Resource Strain (CARDIA)    Difficulty of Paying Living Expenses: Not hard at all  Food Insecurity: No Food Insecurity (12/05/2023)   Hunger Vital Sign    Worried About Running Out of Food in the Last Year: Never true    Ran Out of Food in the Last Year: Never true  Recent Concern: Food Insecurity - Food Insecurity Present (11/17/2023)   Hunger Vital Sign    Worried About Running Out of Food in the Last Year: Never true    Ran Out of Food in the Last Year: Sometimes true  Transportation Needs: No Transportation Needs (12/04/2023)   PRAPARE - Administrator, Civil Service (Medical): No    Lack of Transportation (Non-Medical): No  Physical Activity: Sufficiently Active (12/05/2023)   Exercise Vital Sign    Days of Exercise per Week: 4 days    Minutes of Exercise per Session: 40 min  Stress: No Stress Concern Present (12/05/2023)   [employer] of Occupational Health - Occupational Stress Questionnaire    Feeling of Stress : Only a little  Social Connections: Socially Integrated (12/04/2023)   Social Connection and Isolation Panel [NHANES]    Frequency of Communication with Friends and Family: More than three times a week    Frequency of Social Gatherings with Friends and Family: Once a week    Attends Religious Services: More than 4 times per year    Active Member of [employer] or Organizations: Yes    Attends Engineer, structural: More than 4 times per year    Marital Status: Married     Review of  Systems: A 12 point ROS discussed and pertinent positives are indicated in the HPI above.  All other systems are negative.  Review of Systems  Vital Signs: There were no vitals taken for this visit.  Advance Care Plan: The advanced care place/surrogate decision maker was discussed at the time of visit and the patient did not wish to  discuss or was not able to name a surrogate decision maker or provide an advance care plan.  Physical Exam  Imaging: MM 3D SCREENING MAMMOGRAM BILATERAL BREAST Result Date: 02/05/2024 CLINICAL DATA:  Screening. EXAM: DIGITAL SCREENING BILATERAL MAMMOGRAM WITH TOMOSYNTHESIS AND CAD TECHNIQUE: Bilateral screening digital craniocaudal and mediolateral oblique mammograms were obtained. Bilateral screening digital breast tomosynthesis was performed. The images were evaluated with computer-aided detection. COMPARISON:  Previous exam(s). ACR Breast Density Category c: The breasts are heterogeneously dense, which may obscure small masses. FINDINGS: There are no findings suspicious for malignancy. IMPRESSION: No mammographic evidence of malignancy. A result letter of this screening mammogram will be mailed directly to the patient. RECOMMENDATION: Screening mammogram in one year. (Code:SM-B-01Y) BI-RADS CATEGORY  1: Negative. Electronically Signed   By: Allena Ito M.D.   On: 02/05/2024 10:53    Labs:  CBC: Recent Labs    01/10/24 1018 01/26/24 1140  WBC 2.9* 3.1*  HGB 12.9 12.4  HCT 39.5 38.3  PLT 232.0 217    COAGS: No results for input(s): "INR", "APTT" in the last 8760 hours.  BMP: Recent Labs    01/10/24 1018  NA 142  K 4.3  CL 106  CO2 28  GLUCOSE 83  BUN 14  CALCIUM 9.5  CREATININE 0.81    LIVER FUNCTION TESTS: Recent Labs    01/10/24 1018  BILITOT 0.4  AST 19  ALT 14  ALKPHOS 57  PROT 7.3  ALBUMIN 4.4    TUMOR MARKERS: No results for input(s): "AFPTM", "CEA", "CA199", "CHROMGRNA" in the last 8760 hours.  Assessment and  Plan:  Kim Mitchell is a 68 y.o. female with pmhx of GERD, IBS, and chronic neutropenia. Seen by Dr. Randy Buttery with oncology for this initially 3-4 years ago, at visit in April 2023, pt had normal smear and normal ferritin, B12, and folate. Plan at that time plan was to treat it conservatively. Pt followed up with Dr. Randy Buttery again 01/24/24 and she repeated lab work with plan for bone marrow biopsy if no etiology found for neutropenia. Pt now presenting to IR for image guided bone marrow biopsy.  Risks and benefits of image guided bone marrow biopsy was discussed with the patient and/or patient's family including, but not limited to bleeding, infection, damage to adjacent structures or low yield requiring additional tests.  All of the questions were answered and there is agreement to proceed.  Consent signed and in chart.   Thank you for allowing our service to participate in Kim Mitchell 's care.  Electronically Signed: Nicolasa Barrett, PA-C   02/13/2024, 2:11 PM      I spent a total of {New WUJW:[id number]} {New Out-Pt:[id number]}  {Established Out-Pt:[id number]} in face to face in clinical consultation, greater than 50% of which was counseling/coordinating care for image guided bone marrow biopsy.

## 2024-02-14 ENCOUNTER — Encounter (HOSPITAL_COMMUNITY): Payer: Self-pay

## 2024-02-14 ENCOUNTER — Ambulatory Visit: Admitting: Oncology

## 2024-02-14 NOTE — Progress Notes (Signed)
 Patient for CT Bone Marrow Biopsy on Thurs 02/15/24, I called and spoke with the patient on the phone and gave pre-procedure instructions. Pt was made aware to be here at 7:30a, NPO after MN prior to procedure as well as driver post procedure/recovery/discharge. Pt stated understanding.  Called 02/14/24

## 2024-02-15 ENCOUNTER — Other Ambulatory Visit: Payer: Self-pay

## 2024-02-15 ENCOUNTER — Ambulatory Visit
Admission: RE | Admit: 2024-02-15 | Discharge: 2024-02-15 | Disposition: A | Discharge status: Home / Self Care | Source: Ambulatory Visit | Attending: Oncology | Admitting: Oncology

## 2024-02-15 DIAGNOSIS — D709 Neutropenia, unspecified: Secondary | ICD-10-CM | POA: Insufficient documentation

## 2024-02-15 DIAGNOSIS — K219 Gastro-esophageal reflux disease without esophagitis: Secondary | ICD-10-CM | POA: Diagnosis not present

## 2024-02-15 DIAGNOSIS — D708 Other neutropenia: Secondary | ICD-10-CM

## 2024-02-15 LAB — CBC WITH DIFFERENTIAL/PLATELET
Abs Immature Granulocytes: 0.01 10*3/uL (ref 0.00–0.07)
Basophils Absolute: 0 10*3/uL (ref 0.0–0.1)
Basophils Relative: 1 %
Eosinophils Absolute: 0.4 10*3/uL (ref 0.0–0.5)
Eosinophils Relative: 9 %
HCT: 37.9 % (ref 36.0–46.0)
Hemoglobin: 12.3 g/dL (ref 12.0–15.0)
Immature Granulocytes: 0 %
Lymphocytes Relative: 50 %
Lymphs Abs: 2.1 10*3/uL (ref 0.7–4.0)
MCH: 24 pg — ABNORMAL LOW (ref 26.0–34.0)
MCHC: 32.5 g/dL (ref 30.0–36.0)
MCV: 73.9 fL — ABNORMAL LOW (ref 80.0–100.0)
Monocytes Absolute: 0.4 10*3/uL (ref 0.1–1.0)
Monocytes Relative: 10 %
Neutro Abs: 1.2 10*3/uL — ABNORMAL LOW (ref 1.7–7.7)
Neutrophils Relative %: 30 %
Platelets: 237 10*3/uL (ref 150–400)
RBC: 5.13 MIL/uL — ABNORMAL HIGH (ref 3.87–5.11)
RDW: 15.6 % — ABNORMAL HIGH (ref 11.5–15.5)
WBC: 4.1 10*3/uL (ref 4.0–10.5)
nRBC: 0 % (ref 0.0–0.2)

## 2024-02-15 MED ORDER — FENTANYL CITRATE (PF) 100 MCG/2ML IJ SOLN
INTRAMUSCULAR | Status: AC | PRN
  Administered 2024-02-15: 50 ug via INTRAVENOUS

## 2024-02-15 MED ORDER — MIDAZOLAM HCL 2 MG/2ML IJ SOLN
INTRAMUSCULAR | Status: AC
  Filled 2024-02-15: qty 2

## 2024-02-15 MED ORDER — MIDAZOLAM HCL 5 MG/5ML IJ SOLN
INTRAMUSCULAR | Status: AC | PRN
  Administered 2024-02-15: 1 mg via INTRAVENOUS

## 2024-02-15 MED ORDER — SODIUM CHLORIDE 0.9 % IV SOLN
INTRAVENOUS | Status: DC
Start: 2024-02-15 — End: 2024-02-16

## 2024-02-15 MED ORDER — FENTANYL CITRATE (PF) 100 MCG/2ML IJ SOLN
INTRAMUSCULAR | Status: AC
  Filled 2024-02-15: qty 2

## 2024-02-15 MED ORDER — LIDOCAINE HCL (PF) 1 % IJ SOLN
10.0000 mL | Freq: Once | INTRAMUSCULAR | Status: AC
  Administered 2024-02-15: 10 mL
  Filled 2024-02-15: qty 10

## 2024-02-15 MED ORDER — HEPARIN SOD (PORK) LOCK FLUSH 100 UNIT/ML IV SOLN
INTRAVENOUS | Status: AC
Start: 2024-02-15 — End: 2024-02-15
  Administered 2024-02-15: 500 [IU]
  Filled 2024-02-15: qty 5

## 2024-02-15 NOTE — Procedures (Signed)
 Interventional Radiology Procedure Note  Procedure: CT guided bone marrow aspiration and biopsy  Complications: None  EBL: < 10 mL  Findings: Aspirate and core biopsy performed of bone marrow in right iliac bone.  Plan: Bedrest supine x 1 hrs  Ericha Whittingham T. Fredia Sorrow, M.D Pager:  [phone]

## 2024-02-15 NOTE — Progress Notes (Signed)
 Patient clinically stable post CT BMB per Dr Nereida Banning, tolerated well. Vitals stable pre and post procedure. Received Versed 1 mg along with Fentanyl 50 mcg IV for procedure. Report given to [facility] Rn post procedure/specials./16

## 2024-02-19 LAB — SURGICAL PATHOLOGY

## 2024-02-20 ENCOUNTER — Ambulatory Visit: Payer: Self-pay

## 2024-02-20 NOTE — Telephone Encounter (Signed)
-----   Message from Avonne Boettcher sent at 02/19/2024  2:51 PM EDT ----- Please call [facility] and ask them to add myeloid next gen seq on her bone marrow specimen

## 2024-02-20 NOTE — Telephone Encounter (Signed)
 Per Dr. Randy Buttery "Please call [facility] and ask them to add myeloid next gen seq on her bone marrow specimen".  Spoke to Amanda; indicated the order was being added on right now.

## 2024-02-21 ENCOUNTER — Ambulatory Visit
Admission: RE | Admit: 2024-02-21 | Discharge: 2024-02-21 | Disposition: A | Discharge status: Home / Self Care | Source: Ambulatory Visit | Attending: Family Medicine | Admitting: Family Medicine

## 2024-02-21 DIAGNOSIS — Z780 Asymptomatic menopausal state: Secondary | ICD-10-CM | POA: Insufficient documentation

## 2024-02-23 ENCOUNTER — Encounter (HOSPITAL_COMMUNITY): Payer: Self-pay | Admitting: Oncology

## 2024-02-26 ENCOUNTER — Inpatient Hospital Stay: Attending: Oncology | Admitting: Oncology

## 2024-02-26 ENCOUNTER — Encounter: Payer: Self-pay | Admitting: Oncology

## 2024-02-26 VITALS — BP 132/92 | HR 70 | Temp 98.8°F | Resp 19 | Ht 65.0 in | Wt 156.5 lb

## 2024-02-26 DIAGNOSIS — M25519 Pain in unspecified shoulder: Secondary | ICD-10-CM | POA: Insufficient documentation

## 2024-02-26 DIAGNOSIS — Z882 Allergy status to sulfonamides status: Secondary | ICD-10-CM | POA: Diagnosis not present

## 2024-02-26 DIAGNOSIS — Z8249 Family history of ischemic heart disease and other diseases of the circulatory system: Secondary | ICD-10-CM | POA: Diagnosis not present

## 2024-02-26 DIAGNOSIS — Z803 Family history of malignant neoplasm of breast: Secondary | ICD-10-CM | POA: Diagnosis not present

## 2024-02-26 DIAGNOSIS — Z885 Allergy status to narcotic agent status: Secondary | ICD-10-CM | POA: Diagnosis not present

## 2024-02-26 DIAGNOSIS — D708 Other neutropenia: Secondary | ICD-10-CM | POA: Diagnosis not present

## 2024-02-26 DIAGNOSIS — D573 Sickle-cell trait: Secondary | ICD-10-CM | POA: Insufficient documentation

## 2024-02-26 DIAGNOSIS — M2550 Pain in unspecified joint: Secondary | ICD-10-CM | POA: Insufficient documentation

## 2024-02-26 DIAGNOSIS — K589 Irritable bowel syndrome without diarrhea: Secondary | ICD-10-CM | POA: Diagnosis not present

## 2024-02-26 DIAGNOSIS — Z90710 Acquired absence of both cervix and uterus: Secondary | ICD-10-CM | POA: Diagnosis not present

## 2024-02-26 DIAGNOSIS — D709 Neutropenia, unspecified: Secondary | ICD-10-CM | POA: Diagnosis present

## 2024-02-26 DIAGNOSIS — Z79899 Other long term (current) drug therapy: Secondary | ICD-10-CM | POA: Insufficient documentation

## 2024-02-26 DIAGNOSIS — Z801 Family history of malignant neoplasm of trachea, bronchus and lung: Secondary | ICD-10-CM | POA: Diagnosis not present

## 2024-02-26 DIAGNOSIS — Z8616 Personal history of COVID-19: Secondary | ICD-10-CM | POA: Diagnosis not present

## 2024-02-26 DIAGNOSIS — Z8049 Family history of malignant neoplasm of other genital organs: Secondary | ICD-10-CM | POA: Diagnosis not present

## 2024-02-27 NOTE — Progress Notes (Signed)
 Hematology/Oncology Consult note [facility]  [phone] [phone]  Patient Care Team: Valli Gaw, MD as PCP - General (Family Medicine) Avonne Boettcher, MD as Consulting Physician (Hematology and Oncology)   Name of the patient: Kim Mitchell  [id number]  05-25-1956   Date of visit: 02/27/24  Diagnosis-neutropenia of unclear etiology  Chief complaint/ Reason for visit-discuss bone marrow biopsy results and further management  Heme/Onc history:  Patient is a 68 year old female seen in the past for leukopenia.  Her past medical history significant for GERD and irritable bowel syndrome.Results of blood work from 01/24/2022 showed white cell count of 3.5, H&H of 12.9/40.1 with an MCV of 73And a platelet count of 237.  Ferritin B12 folate normal.  Smear review unremarkable.  Conservative management for leukopenia was recommended at that time.   Results of blood work from 01/26/2024 showed white count of 3.1 with an ANC of 0.9.  H&H 12.4/38.3 with an MCV of 74.5 and a platelet count of 217.  Sickle cell trait positive.  Flow cytometry unremarkable.  ANA comprehensive panel, rheumatoid factor, B12 folate unremarkable.  T-cell gene rearrangement assay unremarkable.  Patient underwent bone marrow biopsy on 02/15/2024 which showed hypocellular bone marrow with mild erythroid and marrow acaricide dyspoiesis insufficient for the diagnosis of frank dysplasia.  Overall specimen limited for evaluation with significant aspiration artifact and sampling artifact cannot be excluded.  Findings were not pathognomonic for myelodysplastic syndrome.  Cytogenetics normal.  Myeloid mutation panel currently pending   Interval history-patient reports bilateral shoulder pain for the last couple of weeks.  Muscle relaxers have not helped her.  No other acute issues.  ECOG PS- 0 Pain scale- 0   Review of systems- Review of Systems  Constitutional:  Negative for  chills, fever, malaise/fatigue and weight loss.  HENT:  Negative for congestion, ear discharge and nosebleeds.   Eyes:  Negative for blurred vision.  Respiratory:  Negative for cough, hemoptysis, sputum production, shortness of breath and wheezing.   Cardiovascular:  Negative for chest pain, palpitations, orthopnea and claudication.  Gastrointestinal:  Negative for abdominal pain, blood in stool, constipation, diarrhea, heartburn, melena, nausea and vomiting.  Genitourinary:  Negative for dysuria, flank pain, frequency, hematuria and urgency.  Musculoskeletal:  Positive for joint pain. Negative for back pain and myalgias.  Skin:  Negative for rash.  Neurological:  Negative for dizziness, tingling, focal weakness, seizures, weakness and headaches.  Endo/Heme/Allergies:  Does not bruise/bleed easily.  Psychiatric/Behavioral:  Negative for depression and suicidal ideas. The patient does not have insomnia.       Allergies  Allergen Reactions   Sulfa Antibiotics Swelling    Facial Swelling   Demerol [Meperidine Hcl] Rash     Past Medical History:  Diagnosis Date   Acute left ankle pain 12/28/2020   Chronic insomnia 02/16/2022   COVID-19    Encounter for administration of vaccine 10/27/2022   Fibroids    confrimed from medical records   GERD (gastroesophageal reflux disease)    H. pylori infection    Heart murmur    IBS (irritable bowel syndrome)    Kidney stone    2004   Leukocytes in urine 12/16/2019   Leukopenia    Screening for blood or protein in urine 12/16/2019   Sickle cell trait (HCC)    Soft tissue disorder of ankle 12/28/2020   Swelling of joint of hand 12/16/2019   Vaginal dryness, menopausal 12/16/2019     Past Surgical History:  Procedure Laterality Date   BREAST BIOPSY Right 01/29/2021   Affirm bx-"X" clip-Benign   BREAST BIOPSY Right 03/02/2021   Sffirm bx- coil clip, path pending    BREAST CYST EXCISION Right 50 years ago ~1970   neg   BREAST SURGERY      LAPAROSCOPIC ASSISTED VAGINAL HYSTERECTOMY  11/18/2009   partial hysterectomy only ovaries left no h/o h/o abnormal pap    LITHOTRIPSY Right    2004   TUBAL LIGATION     1985    Social History   Socioeconomic History   Marital status: Married    Spouse name: Not on file   Number of children: Not on file   Years of education: Not on file   Highest education level: Not on file  Occupational History   Not on file  Tobacco Use   Smoking status: Never   Smokeless tobacco: Never  Substance and Sexual Activity   Alcohol use: Not Currently   Drug use: Never   Sexual activity: Not Currently  Other Topics Concern   Not on file  Social History Narrative   7 siblings, 4 living as of 02/12/21    Retired Arts administrator    2 female kids 45, 37 02/12/21    [place] up [place] moved to [place]  2nd grade relocated here [employer] eduation   Social Drivers of Health   Financial Resource Strain: Low Risk  (12/04/2023)   Overall Financial Resource Strain (CARDIA)    Difficulty of Paying Living Expenses: Not hard at all  Food Insecurity: No Food Insecurity (12/05/2023)   Hunger Vital Sign    Worried About Running Out of Food in the Last Year: Never true    Ran Out of Food in the Last Year: Never true  Recent Concern: Food Insecurity - Food Insecurity Present (11/17/2023)   Hunger Vital Sign    Worried About Running Out of Food in the Last Year: Never true    Ran Out of Food in the Last Year: Sometimes true  Transportation Needs: No Transportation Needs (12/04/2023)   PRAPARE - Administrator, Civil Service (Medical): No    Lack of Transportation (Non-Medical): No  Physical Activity: Sufficiently Active (12/05/2023)   Exercise Vital Sign    Days of Exercise per Week: 4 days    Minutes of Exercise per Session: 40 min  Stress: No Stress Concern Present (12/05/2023)   [employer] of Occupational Health - Occupational Stress Questionnaire    Feeling of Stress : Only a little   Social Connections: Socially Integrated (12/04/2023)   Social Connection and Isolation Panel [NHANES]    Frequency of Communication with Friends and Family: More than three times a week    Frequency of Social Gatherings with Friends and Family: Once a week    Attends Religious Services: More than 4 times per year    Active Member of [employer] or Organizations: Yes    Attends Engineer, structural: More than 4 times per year    Marital Status: Married  Catering manager Violence: Not At Risk (12/05/2023)   Humiliation, Afraid, Rape, and Kick questionnaire    Fear of Current or Ex-Partner: No    Emotionally Abused: No    Physically Abused: No    Sexually Abused: No    Family History  Problem Relation Age of Onset   Cancer Sister        jaw    Heart attack Brother    Cancer Brother  GU cancer   Lung cancer Maternal Aunt    Lung cancer Maternal Aunt    Hypertension Maternal Grandmother    Cancer Maternal Grandfather        ?type   Breast cancer Cousin    Uterine cancer Cousin    Hypertension Other        siblings      Current Outpatient Medications:    acetaminophen (TYLENOL) 500 MG tablet, Take 500 mg by mouth every 6 (six) hours as needed., Disp: , Rfl:    Calcium Carbonate-Vitamin D  600-400 MG-UNIT tablet, Take 2 tablets by mouth daily., Disp: , Rfl:    docusate sodium (COLACE) 100 MG capsule, Take 100 mg by mouth as needed for mild constipation., Disp: , Rfl:    fluticasone  (FLONASE ) 50 MCG/ACT nasal spray, Place 2 sprays into both nostrils daily., Disp: 18.2 mL, Rfl: 11   Iron, Ferrous Sulfate, 325 (65 Fe) MG TABS, Take 1 tablet by mouth every other day. (Patient not taking: Reported on 12/05/2023), Disp: , Rfl:    levocetirizine (XYZAL ) 5 MG tablet, TAKE 1 TABLET BY MOUTH EVERY DAY IN THE EVENING, Disp: 90 tablet, Rfl: 1   Melatonin 10 MG CAPS, Take 10 mg by mouth., Disp: , Rfl:    Multiple Vitamins-Minerals (CENTRUM SILVER PO), Take 1 tablet by mouth daily.,  Disp: , Rfl:    omeprazole  (PRILOSEC) 20 MG capsule, Take 1 capsule (20 mg total) by mouth daily. (Patient not taking: Reported on 02/15/2024), Disp: 60 capsule, Rfl: 0   tiZANidine  (ZANAFLEX ) 4 MG tablet, Take 0.5 tablets (2 mg total) by mouth every 6 (six) hours as needed for muscle spasms., Disp: 60 tablet, Rfl: 0  Physical exam:  Vitals:   02/26/24 1512  BP: (!) 132/92  Pulse: 70  Resp: 19  Temp: 98.8 F (37.1 C)  TempSrc: Tympanic  SpO2: 100%  Weight: 156 lb 8 oz (71 kg)  Height: 5\' 5"  (1.651 m)   Physical Exam Cardiovascular:     Rate and Rhythm: Normal rate and regular rhythm.     Heart sounds: Normal heart sounds.  Pulmonary:     Effort: Pulmonary effort is normal.     Breath sounds: Normal breath sounds.  Skin:    General: Skin is warm and dry.  Neurological:     Mental Status: She is alert and oriented to person, place, and time.      I have personally reviewed labs listed below:    Latest Ref Rng & Units 01/10/2024   10:18 AM  CMP  Glucose 70 - 99 mg/dL 83   BUN 6 - 23 mg/dL 14   Creatinine 8.11 - 1.20 mg/dL 9.14   Sodium 782 - 956 mEq/L 142   Potassium 3.5 - 5.1 mEq/L 4.3   Chloride 96 - 112 mEq/L 106   CO2 19 - 32 mEq/L 28   Calcium 8.4 - 10.5 mg/dL 9.5   Total Protein 6.0 - 8.3 g/dL 7.3   Total Bilirubin 0.2 - 1.2 mg/dL 0.4   Alkaline Phos 39 - 117 U/L 57   AST 0 - 37 U/L 19   ALT 0 - 35 U/L 14       Latest Ref Rng & Units 02/15/2024    8:10 AM  CBC  WBC 4.0 - 10.5 K/uL 4.1   Hemoglobin 12.0 - 15.0 g/dL 21.3   Hematocrit 08.6 - 46.0 % 37.9   Platelets 150 - 400 K/uL 237    I have personally reviewed Radiology  images listed below: No images are attached to the encounter.  DG Bone Density Result Date: 02/21/2024 EXAM: DUAL X-RAY ABSORPTIOMETRY (DXA) FOR BONE MINERAL DENSITY 02/21/2024 10:01 am CLINICAL DATA:  68 year old Female Postmenopausal. Post menopausal, estrogen deficiency History of fragility fracture. TECHNIQUE: An axial (e.g., hips, spine)  and/or appendicular (e.g., radius) exam was performed, as appropriate, using GE Secretary/administrator at [facility]. Images are obtained for bone mineral density measurement and are not obtained for diagnostic purposes. [id number] Exclusions: L3 COMPARISON:  01/26/2022 FINDINGS: Scan quality: Good. LUMBAR SPINE (L1-L2, L4): BMD (in g/cm2): 1.086 T-score: -0.8 Z-score: 0.1 Rate of change from previous exam: -3.7 % LEFT FEMORAL NECK: BMD (in g/cm2): 1.020 T-score: -0.1 Z-score: 0.5 LEFT TOTAL HIP: BMD (in g/cm2): 0.981 T-score: -0.2 Z-score: 0.1 Rate of change from previous exam: No significant rate of change from previous exam. RIGHT FEMORAL NECK: BMD (in g/cm2): 1.063 T-score: 0.2 Z-score: 0.8 RIGHT TOTAL HIP: BMD (in g/cm2): 1.005 T-score: 0.0 Z-score: 0.3 DUAL-FEMUR TOTAL MEAN: Rate of change from previous exam: No significant rate of change from previous exam. FRAX 10-YEAR PROBABILITY OF FRACTURE: FRAX not reported as the lowest BMD is not in the osteopenia range. IMPRESSION: Normal based on BMD. Fracture risk is increased. Increased risk is based on history of fragility fracture. RECOMMENDATIONS: 1. All patients should optimize calcium and vitamin D  intake. 2. Consider FDA-approved medical therapies in postmenopausal women and men aged 48 years and older, based on the following: - A hip or vertebral (clinical or morphometric) fracture - T-score less than or equal to -2.5 and secondary causes have been excluded. - Low bone mass (T-score between -1.0 and -2.5) and a 10-year probability of a hip fracture greater than or equal to 3% or a 10-year probability of a major osteoporosis-related fracture greater than or equal to 20% based on the [place] -adapted WHO algorithm. - Clinician judgment and/or patient preferences may indicate treatment for people with 10-year fracture probabilities above or below these levels 3. Patients with diagnosis of osteoporosis or at high risk for fracture should have  regular bone mineral density tests. For patients eligible for Medicare, routine testing is allowed once every 2 years. The testing frequency can be increased to one year for patients who have rapidly progressing disease, those who are receiving or discontinuing medical therapy to restore bone mass, or have additional risk factors. Electronically Signed   By: Dina  Arceo M.D.   On: 02/21/2024 12:29   CT BONE MARROW BIOPSY & ASPIRATION Result Date: 02/15/2024 CLINICAL DATA:  Neutropenia and need for bone marrow biopsy. EXAM: CT GUIDED BONE MARROW ASPIRATION AND BIOPSY ANESTHESIA/SEDATION: Moderate (conscious) sedation was employed during this procedure. A total of Versed  1.0 mg and Fentanyl  50 mcg was administered intravenously. Moderate Sedation Time: 14 minutes. The patient's level of consciousness and vital signs were monitored continuously by radiology nursing throughout the procedure under my direct supervision. PROCEDURE: The procedure risks, benefits, and alternatives were explained to the patient. Questions regarding the procedure were encouraged and answered. The patient understands and consents to the procedure. A time out was performed prior to initiating the procedure. The right gluteal region was prepped with chlorhexidine. Sterile gown and sterile gloves were used for the procedure. Local anesthesia was provided with 1% Lidocaine . Under CT guidance, an 11 gauge On Control bone cutting needle was advanced from a posterior approach into the right iliac bone. Needle positioning was confirmed with CT. Initial non heparinized and heparinized aspirate samples were  obtained of bone marrow. Core biopsy was performed via the On Control drill needle. COMPLICATIONS: None FINDINGS: Inspection of initial aspirate did reveal visible particles. Intact core biopsy sample was obtained. IMPRESSION: CT guided bone marrow biopsy of right posterior iliac bone with both aspirate and core samples obtained. Electronically  Signed   By: Erica Hau M.D.   On: 02/15/2024 09:30   MM 3D SCREENING MAMMOGRAM BILATERAL BREAST Result Date: 02/05/2024 CLINICAL DATA:  Screening. EXAM: DIGITAL SCREENING BILATERAL MAMMOGRAM WITH TOMOSYNTHESIS AND CAD TECHNIQUE: Bilateral screening digital craniocaudal and mediolateral oblique mammograms were obtained. Bilateral screening digital breast tomosynthesis was performed. The images were evaluated with computer-aided detection. COMPARISON:  Previous exam(s). ACR Breast Density Category c: The breasts are heterogeneously dense, which may obscure small masses. FINDINGS: There are no findings suspicious for malignancy. IMPRESSION: No mammographic evidence of malignancy. A result letter of this screening mammogram will be mailed directly to the patient. RECOMMENDATION: Screening mammogram in one year. (Code:SM-B-01Y) BI-RADS CATEGORY  1: Negative. Electronically Signed   By: Allena Ito M.D.   On: 02/05/2024 10:53     Assessment and plan- Patient is a 68 y.o. female with history of neutropenia here to discuss bone marrow biopsy results and further management  Patient has had leukopenia with a white cell count that fluctuates between 2.9-4.5 at least since 2021.  Neutrophil counts are mostly remained more than 1 but over the last 1 year they have dropped down to 0.7-0.9.  Hemoglobin is normal and microcytosis explained by sickle cell trait.  Platelet counts are normal.  She has not had any recurrent infections.  There has been no consistent downward trend in her neutrophil counts over the last 3 years.  Bone marrow biopsy was hypocellular but there is presence of significant aspiration artifact.  There was some dyspoiesis noted in the granulocytic and megakaryocytic lineage but was not conclusive for myelodysplastic syndrome.  Given that patient has had neutropenia at least since 2021 I am planning to monitor response of active treatment at this time.  I am also awaiting myeloid mutation  panel testing.  CBC with differential in 3 months in 6 months and I will see her back  With regards to her shoulder pain encouraged her to speak to her primary care doctor about it   Visit Diagnosis 1. Other neutropenia (HCC)      Dr. Seretha Dance, MD, MPH [facility] at [facility] [phone] 02/27/2024 8:31 AM

## 2024-03-01 NOTE — Telephone Encounter (Signed)
 Called [facility] patho, was transferred to client services and spoke with Trenia Fritter.  Trenia Fritter indicated to call Albin Huh [phone]  and can speak with either Jefrey Mink or Tonya.  Trenia Fritter said they cannot see it on their end; they're an independent laboratory and pathology at the hospital has their own computer system.  [facility] does not have access to Epic hence no access to the hospital report.  Trenia Fritter did indicate a stain was performed and they will be able to tell if it was added to the specimen.

## 2024-03-01 NOTE — Telephone Encounter (Signed)
 Tried calling [employer] number provided in previous note; it is a non-working number.  Outbound call to [phone], spoke to [place] who then transferred me to [place].  Peterson Brandt was able to retrieve report and will fax it over.

## 2024-03-01 NOTE — Telephone Encounter (Signed)
 Myeloid report received via fax and provided to Dr. Randy Buttery.

## 2024-03-06 ENCOUNTER — Encounter (HOSPITAL_COMMUNITY): Payer: Self-pay

## 2024-03-08 ENCOUNTER — Other Ambulatory Visit: Payer: Self-pay | Admitting: Family Medicine

## 2024-03-08 DIAGNOSIS — T148XXA Other injury of unspecified body region, initial encounter: Secondary | ICD-10-CM

## 2024-03-17 ENCOUNTER — Ambulatory Visit: Payer: Self-pay | Admitting: Family Medicine

## 2024-05-01 ENCOUNTER — Ambulatory Visit (INDEPENDENT_AMBULATORY_CARE_PROVIDER_SITE_OTHER): Admitting: Internal Medicine

## 2024-05-01 ENCOUNTER — Encounter: Payer: Self-pay | Admitting: Internal Medicine

## 2024-05-01 VITALS — BP 134/82 | HR 64 | Temp 98.0°F | Ht 65.0 in | Wt 153.0 lb

## 2024-05-01 DIAGNOSIS — H259 Unspecified age-related cataract: Secondary | ICD-10-CM | POA: Diagnosis not present

## 2024-05-01 DIAGNOSIS — D708 Other neutropenia: Secondary | ICD-10-CM | POA: Diagnosis not present

## 2024-05-01 DIAGNOSIS — H269 Unspecified cataract: Secondary | ICD-10-CM | POA: Insufficient documentation

## 2024-05-01 NOTE — Patient Instructions (Addendum)
-   It was a pleasure meeting you today -I have put in a referral to ophthalmology for cataract surgery -Please follow-up with Dr. Melanee for your decreased white count as scheduled -Please contact us  with any questions or concerns or if you need any refills

## 2024-05-01 NOTE — Progress Notes (Signed)
 Acute Office Visit  Subjective:     Patient ID: Kim Mitchell, female    DOB: 06-17-1956, 68 y.o.   MRN: [id number]  Chief Complaint  Patient presents with   Acute Visit    Cataract surgery referral    HPI Patient is in today for referral for Cataract surgery.  Patient states that she has her eye appointments yearly in [place] with Dr. Darice Molt ([facility]).  At her last eye appointment she was noted to have worsening bilateral cataracts (worse on the left than the right) and opted for cataract surgery.  Patient did not note any difficulty driving but does state that she has a mildly diminished vision in the left eye.  She does state that driving at night bothers her because of the glare from oncoming headlights.  She denies any other complaints at this time  Patient has been noted to have leukopenia with a white cell count of fluctuates between 2.9 and 4.5 since 2021 and intermittent neutropenia.  She is following with oncology for this.  No complaints currently.  Review of Systems  Constitutional: Negative.   HENT: Negative.    Eyes:        Mildly diminished vision in left eye and difficulty driving at night secondary to glare from oncoming headlights  Respiratory: Negative.    Cardiovascular: Negative.   Neurological: Negative.   Psychiatric/Behavioral: Negative.          Objective:    BP 134/82   Pulse 64   Temp 98 F (36.7 C)   Ht 5' 5 (1.651 m)   Wt 153 lb (69.4 kg)   SpO2 97%   BMI 25.46 kg/m    Physical Exam Constitutional:      Appearance: Normal appearance.  HENT:     Head: Normocephalic and atraumatic.  Eyes:     Pupils: Pupils are equal, round, and reactive to light.  Cardiovascular:     Rate and Rhythm: Normal rate and regular rhythm.     Heart sounds: Normal heart sounds.  Pulmonary:     Breath sounds: Normal breath sounds. No wheezing or rales.  Musculoskeletal:     Cervical back: Neck supple.  Lymphadenopathy:      Cervical: No cervical adenopathy.  Neurological:     General: No focal deficit present.     Mental Status: She is alert and oriented to person, place, and time.  Psychiatric:        Mood and Affect: Mood normal.        Behavior: Behavior normal.     No results found for any visits on 05/01/24.      Assessment & Plan:   Problem List Items Addressed This Visit       Other   Cataracts, bilateral - Primary   - Patient has a history of bilateral cataracts which have been gradually worsening  - She follows with Dr. Darice Molt in [place] who told her that her cataracts are worsening especially in the left eye -Patient has noted mildly diminished vision in that eye but has no difficulty driving except at night when oncoming headlight flare bothers her eyes -She would like cataract surgery for this and would like to see a surgeon in [place] since she lives in the area -A referral to ophthalmology placed for cataract surgery -Cataract surgery is a low risk surgery and does not require preop evaluation routinely -No further workup at this time      Relevant Orders  Ambulatory referral to Ophthalmology   Other neutropenia [facility])   - Patient has been following up with hematology for this -Bone marrow biopsy showed hypocellular marrow but with significant artifact -Last CBC in May showed a normal white count of 4.1 with mild neutropenia -Hematology will continue to monitor her.  Next blood work is due on August 18 and she will follow-up with Dr. Melanee in November -No further workup at this time       No orders of the defined types were placed in this encounter.   No follow-ups on file.  Saathvik Every, MD

## 2024-05-01 NOTE — Assessment & Plan Note (Addendum)
-   Patient has a history of bilateral cataracts which have been gradually worsening  - She follows with Dr. Darice Molt in [place] who told her that her cataracts are worsening especially in the left eye -Patient has noted mildly diminished vision in that eye but has no difficulty driving except at night when oncoming headlight flare bothers her eyes -She would like cataract surgery for this and would like to see a surgeon in [place] since she lives in the area -A referral to ophthalmology placed for cataract surgery -Cataract surgery is a low risk surgery and does not require preop evaluation routinely -No further workup at this time

## 2024-05-01 NOTE — Assessment & Plan Note (Addendum)
-   Patient has been following up with hematology for this -Bone marrow biopsy showed hypocellular marrow but with significant artifact -Last CBC in May showed a normal white count of 4.1 with mild neutropenia -Hematology will continue to monitor her.  Next blood work is due on August 18 and she will follow-up with Dr. Melanee in November -No further workup at this time

## 2024-05-27 ENCOUNTER — Inpatient Hospital Stay: Attending: Oncology

## 2024-05-27 DIAGNOSIS — Z79899 Other long term (current) drug therapy: Secondary | ICD-10-CM | POA: Diagnosis not present

## 2024-05-27 DIAGNOSIS — D708 Other neutropenia: Secondary | ICD-10-CM | POA: Diagnosis present

## 2024-05-27 LAB — CBC WITH DIFFERENTIAL (CANCER CENTER ONLY)
Abs Immature Granulocytes: 0.01 K/uL (ref 0.00–0.07)
Basophils Absolute: 0 K/uL (ref 0.0–0.1)
Basophils Relative: 1 %
Eosinophils Absolute: 0.2 K/uL (ref 0.0–0.5)
Eosinophils Relative: 6 %
HCT: 38.7 % (ref 36.0–46.0)
Hemoglobin: 12.2 g/dL (ref 12.0–15.0)
Immature Granulocytes: 0 %
Lymphocytes Relative: 44 %
Lymphs Abs: 1.5 K/uL (ref 0.7–4.0)
MCH: 23.6 pg — ABNORMAL LOW (ref 26.0–34.0)
MCHC: 31.5 g/dL (ref 30.0–36.0)
MCV: 75 fL — ABNORMAL LOW (ref 80.0–100.0)
Monocytes Absolute: 0.4 K/uL (ref 0.1–1.0)
Monocytes Relative: 11 %
Neutro Abs: 1.3 K/uL — ABNORMAL LOW (ref 1.7–7.7)
Neutrophils Relative %: 38 %
Platelet Count: 229 K/uL (ref 150–400)
RBC: 5.16 MIL/uL — ABNORMAL HIGH (ref 3.87–5.11)
RDW: 15.5 % (ref 11.5–15.5)
WBC Count: 3.4 K/uL — ABNORMAL LOW (ref 4.0–10.5)
nRBC: 0 % (ref 0.0–0.2)

## 2024-05-31 ENCOUNTER — Encounter: Payer: Self-pay | Admitting: Ophthalmology

## 2024-06-05 ENCOUNTER — Other Ambulatory Visit: Payer: Self-pay | Admitting: General Practice

## 2024-06-05 DIAGNOSIS — T148XXA Other injury of unspecified body region, initial encounter: Secondary | ICD-10-CM

## 2024-06-05 DIAGNOSIS — J302 Other seasonal allergic rhinitis: Secondary | ICD-10-CM

## 2024-06-05 MED ORDER — TIZANIDINE HCL 4 MG PO TABS: 2.0000 mg | ORAL_TABLET | Freq: Four times a day (QID) | ORAL | 0 refills | Status: AC | PRN

## 2024-06-05 MED ORDER — FLUTICASONE PROPIONATE 50 MCG/ACT NA SUSP: 2.0000 | Freq: Every day | NASAL | 11 refills | Status: AC

## 2024-06-05 MED ORDER — LEVOCETIRIZINE DIHYDROCHLORIDE 5 MG PO TABS: 5.0000 mg | ORAL_TABLET | Freq: Every evening | ORAL | 2 refills | Status: AC

## 2024-06-05 NOTE — Discharge Instructions (Signed)

## 2024-06-05 NOTE — Telephone Encounter (Signed)
 Copied from CRM [id number]. Topic: Clinical - Medication Refill >> Jun 05, 2024 11:33 AM Rosina BIRCH wrote: Medication: levocetirizine (XYZAL ) 5 MG tablet, tiZANidine  and fluticasone    Has the patient contacted their pharmacy? Yes (Agent: If no, request that the patient contact the pharmacy for the refill. If patient does not wish to contact the pharmacy document the reason why and proceed with request.) (Agent: If yes, when and what did the pharmacy advise?)  This is the patient's preferred pharmacy:  [facility] [phone] [facility], [place] - [address] [place] [address] Phone: [phone] Fax: [phone]  Is this the correct pharmacy for this prescription? Yes If no, delete pharmacy and type the correct one.   Has the prescription been filled recently? No  Is the patient out of the medication? Yes  Has the patient been seen for an appointment in the last year OR does the patient have an upcoming appointment? Yes  Can we respond through MyChart? Yes  Agent: Please be advised that Rx refills may take up to 3 business days. We ask that you follow-up with your pharmacy.

## 2024-06-06 ENCOUNTER — Other Ambulatory Visit: Payer: Self-pay

## 2024-06-06 ENCOUNTER — Ambulatory Visit: Payer: Self-pay | Admitting: General Practice

## 2024-06-06 ENCOUNTER — Ambulatory Visit
Admission: RE | Admit: 2024-06-06 | Discharge: 2024-06-06 | Disposition: A | Discharge status: Home / Self Care | Attending: Ophthalmology | Admitting: Ophthalmology

## 2024-06-06 ENCOUNTER — Encounter
Admission: RE | Disposition: A | Discharge status: Home / Self Care | Payer: Self-pay | Source: Home / Self Care | Attending: Ophthalmology

## 2024-06-06 ENCOUNTER — Encounter: Payer: Self-pay | Admitting: Ophthalmology

## 2024-06-06 DIAGNOSIS — K219 Gastro-esophageal reflux disease without esophagitis: Secondary | ICD-10-CM | POA: Insufficient documentation

## 2024-06-06 DIAGNOSIS — Z79899 Other long term (current) drug therapy: Secondary | ICD-10-CM | POA: Diagnosis not present

## 2024-06-06 DIAGNOSIS — G709 Myoneural disorder, unspecified: Secondary | ICD-10-CM | POA: Diagnosis not present

## 2024-06-06 DIAGNOSIS — H2512 Age-related nuclear cataract, left eye: Secondary | ICD-10-CM | POA: Diagnosis present

## 2024-06-06 HISTORY — DX: Personal history of diseases of the blood and blood-forming organs and certain disorders involving the immune mechanism: Z86.2

## 2024-06-06 HISTORY — DX: Personal history of urinary calculi: Z87.442

## 2024-06-06 HISTORY — DX: Unspecified osteoarthritis, unspecified site: M19.90

## 2024-06-06 SURGERY — PHACOEMULSIFICATION, CATARACT, WITH IOL INSERTION
Anesthesia: Monitor Anesthesia Care | Site: Eye | Laterality: Left

## 2024-06-06 MED ORDER — LACTATED RINGERS IV SOLN: INTRAVENOUS | Status: DC

## 2024-06-06 MED ORDER — MIDAZOLAM HCL 2 MG/2ML IJ SOLN
INTRAMUSCULAR | Status: AC
  Filled 2024-06-06: qty 2

## 2024-06-06 MED ORDER — SIGHTPATH DOSE#1 BSS IO SOLN
INTRAOCULAR | Status: DC | PRN
  Administered 2024-06-06: 15 mL via INTRAOCULAR

## 2024-06-06 MED ORDER — TETRACAINE HCL 0.5 % OP SOLN
OPHTHALMIC | Status: AC
  Filled 2024-06-06: qty 4

## 2024-06-06 MED ORDER — ARMC OPHTHALMIC DILATING DROPS
OPHTHALMIC | Status: AC
  Filled 2024-06-06: qty 0.5

## 2024-06-06 MED ORDER — FENTANYL CITRATE (PF) 100 MCG/2ML IJ SOLN
INTRAMUSCULAR | Status: AC
Start: 2024-06-06 — End: 2024-06-06
  Filled 2024-06-06: qty 2

## 2024-06-06 MED ORDER — FENTANYL CITRATE (PF) 100 MCG/2ML IJ SOLN
INTRAMUSCULAR | Status: DC | PRN
  Administered 2024-06-06: 100 ug via INTRAVENOUS

## 2024-06-06 MED ORDER — TETRACAINE HCL 0.5 % OP SOLN
1.0000 [drp] | OPHTHALMIC | Status: DC | PRN
  Administered 2024-06-06 (×3): 1 [drp] via OPHTHALMIC

## 2024-06-06 MED ORDER — BRIMONIDINE TARTRATE-TIMOLOL 0.2-0.5 % OP SOLN
OPHTHALMIC | Status: DC | PRN
  Administered 2024-06-06: 1 [drp] via OPHTHALMIC

## 2024-06-06 MED ORDER — LIDOCAINE HCL (PF) 2 % IJ SOLN
INTRAOCULAR | Status: DC | PRN
  Administered 2024-06-06: 4 mL via INTRAOCULAR

## 2024-06-06 MED ORDER — ARMC OPHTHALMIC DILATING DROPS
1.0000 | OPHTHALMIC | Status: DC | PRN
  Administered 2024-06-06 (×3): 1 via OPHTHALMIC

## 2024-06-06 MED ORDER — EPINEPHRINE PF 1 MG/ML IJ SOLN
INTRAMUSCULAR | Status: DC | PRN
  Administered 2024-06-06: 95 mL via OPHTHALMIC

## 2024-06-06 MED ORDER — SIGHTPATH DOSE#1 NA HYALUR & NA CHOND-NA HYALUR IO KIT
PACK | INTRAOCULAR | Status: DC | PRN
  Administered 2024-06-06: 1 via OPHTHALMIC

## 2024-06-06 MED ORDER — MOXIFLOXACIN HCL 0.5 % OP SOLN
OPHTHALMIC | Status: DC | PRN
  Administered 2024-06-06: .2 mL via OPHTHALMIC

## 2024-06-06 MED ORDER — MIDAZOLAM HCL 2 MG/2ML IJ SOLN
INTRAMUSCULAR | Status: DC | PRN
  Administered 2024-06-06: 2 mg via INTRAVENOUS

## 2024-06-06 SURGICAL SUPPLY — 10 items
DISSECTOR HYDRO NUCLEUS 50X22 (MISCELLANEOUS) ×1 IMPLANT
DRSG TEGADERM 2-3/8X2-3/4 SM (GAUZE/BANDAGES/DRESSINGS) ×1 IMPLANT
FEE CATARACT SUITE SIGHTPATH (MISCELLANEOUS) ×1 IMPLANT
GLOVE BIOGEL PI IND STRL 8 (GLOVE) ×1 IMPLANT
GLOVE SURG LX STRL 7.5 STRW (GLOVE) ×1 IMPLANT
GLOVE SURG SYN 6.5 PF PI BL (GLOVE) ×1 IMPLANT
LENS IOL CLRN PAN PRO TRC 7.0 IMPLANT
NDL FILTER BLUNT 18X1 1/2 (NEEDLE) ×1 IMPLANT
NEEDLE FILTER BLUNT 18X1 1/2 (NEEDLE) ×1 IMPLANT
SYR 3ML LL SCALE MARK (SYRINGE) ×1 IMPLANT

## 2024-06-06 NOTE — Transfer of Care (Signed)
 Immediate Anesthesia Transfer of Care Note  Patient: Kim Mitchell  Procedure(s) Performed: PHACOEMULSIFICATION, CATARACT, WITH IOL INSERTION 10.01 01:14.9 (Left: Eye)  Patient Location: PACU  Anesthesia Type: MAC  Level of Consciousness: awake, alert  and patient cooperative  Airway and Oxygen Therapy: Patient Spontanous Breathing and Patient connected to supplemental oxygen  Post-op Assessment: Post-op Vital signs reviewed, Patient's Cardiovascular Status Stable, Respiratory Function Stable, Patent Airway and No signs of Nausea or vomiting  Post-op Vital Signs: Reviewed and stable  Complications: No notable events documented.

## 2024-06-06 NOTE — Anesthesia Preprocedure Evaluation (Signed)
 Anesthesia Evaluation  Patient identified by MRN, date of birth, ID band Patient awake    Reviewed: Allergy & Precautions, NPO status , Patient's Chart, lab work & pertinent test results  Airway Mallampati: III  TM Distance: >3 FB Neck ROM: full    Dental  (+) Chipped   Pulmonary neg pulmonary ROS   Pulmonary exam normal        Cardiovascular negative cardio ROS Normal cardiovascular exam     Neuro/Psych  Neuromuscular disease  negative psych ROS   GI/Hepatic Neg liver ROS,GERD  Medicated,,  Endo/Other  negative endocrine ROS    Renal/GU      Musculoskeletal   Abdominal   Peds  Hematology negative hematology ROS (+)   Anesthesia Other Findings Past Medical History: 12/28/2020: Acute left ankle pain No date: Arthritis 02/16/2022: Chronic insomnia No date: COVID-19 10/27/2022: Encounter for administration of vaccine No date: Fibroids     Comment:  confrimed from medical records No date: GERD (gastroesophageal reflux disease) No date: H. pylori infection No date: Heart murmur No date: History of kidney stones No date: Hx of sickle cell trait No date: IBS (irritable bowel syndrome) No date: Kidney stone     Comment:  2004 12/16/2019: Leukocytes in urine No date: Leukopenia No date: Leukopenia 12/16/2019: Screening for blood or protein in urine No date: Sickle cell trait (HCC) 12/28/2020: Soft tissue disorder of ankle 12/16/2019: Swelling of joint of hand 12/16/2019: Vaginal dryness, menopausal  Past Surgical History: No date: BONE MARROW BIOPSY; N/A 01/29/2021: BREAST BIOPSY; Right     Comment:  Affirm bx-X clip-Benign 03/02/2021: BREAST BIOPSY; Right     Comment:  Sffirm bx- coil clip, path pending  50 years ago ~1970: BREAST CYST EXCISION; Right     Comment:  neg No date: BREAST SURGERY 11/18/2009: LAPAROSCOPIC ASSISTED VAGINAL HYSTERECTOMY     Comment:  partial hysterectomy only ovaries left  no h/o h/o               abnormal pap  No date: LITHOTRIPSY; Right     Comment:  2004 No date: TUBAL LIGATION     Comment:  1985  BMI    Body Mass Index: 24.37 kg/m      Reproductive/Obstetrics negative OB ROS                              Anesthesia Physical Anesthesia Plan  ASA: 2  Anesthesia Plan: MAC   Post-op Pain Management:    Induction: Intravenous  PONV Risk Score and Plan: 2  Airway Management Planned: Natural Airway and Nasal Cannula  Additional Equipment:   Intra-op Plan:   Post-operative Plan:   Informed Consent: I have reviewed the patients History and Physical, chart, labs and discussed the procedure including the risks, benefits and alternatives for the proposed anesthesia with the patient or authorized representative who has indicated his/her understanding and acceptance.     Dental Advisory Given  Plan Discussed with: Anesthesiologist, CRNA and Surgeon  Anesthesia Plan Comments: (Patient consented for risks of anesthesia including but not limited to:  - adverse reactions to medications - damage to eyes, teeth, lips or other oral mucosa - nerve damage due to positioning  - sore throat or hoarseness - Damage to heart, brain, nerves, lungs, other parts of body or loss of life  Patient voiced understanding and assent.)         Anesthesia Quick Evaluation

## 2024-06-06 NOTE — Op Note (Signed)
 OPERATIVE NOTE  Kim Mitchell [id number] 06/06/2024   PREOPERATIVE DIAGNOSIS: Nuclear sclerotic cataract left eye. H25.12   POSTOPERATIVE DIAGNOSIS: Nuclear sclerotic cataract left eye. H25.12   PROCEDURE:  Phacoemulsification with posterior chamber intraocular lens placement of the left eye  Ultrasound time: Procedure(s): PHACOEMULSIFICATION, CATARACT, WITH IOL INSERTION 10.01 01:14.9 (Left)  LENS:   Implant Name Type Inv. Item Serial No. Manufacturer Lot No. LRB No. Used Action  CLAREON PANOPTIX PRO IOL 07.0 Intraocular Lens  [id number]   Left 1 Implanted      SURGEON:  Feliciano HERO. Enola, MD   ANESTHESIA:  Topical with tetracaine  drops, augmented with 1% preservative-free intracameral lidocaine .   COMPLICATIONS:  None.   DESCRIPTION OF PROCEDURE:  The patient was identified in the holding room and transported to the operating room and placed in the supine position under the operating microscope.  The left eye was identified as the operative eye, which was prepped and draped in the usual sterile ophthalmic fashion.   A 1 millimeter clear-corneal paracentesis was made inferotemporally. Preservative-free 1% lidocaine  mixed with 1:1,000 bisulfite-free aqueous solution of epinephrine  was injected into the anterior chamber. The anterior chamber was then filled with Viscoat viscoelastic. A 2.4 millimeter keratome was used to make a clear-corneal incision superotemporally. A curvilinear capsulorrhexis was made with a cystotome and capsulorrhexis forceps. Balanced salt solution was used to hydrodissect and hydrodelineate the nucleus. Phacoemulsification was then used to remove the lens nucleus and epinucleus. The remaining cortex was then removed using the irrigation and aspiration handpiece. Provisc was then placed into the capsular bag to distend it for lens placement. A +7.00 D PXCAT0 intraocular lens was then injected into the capsular bag. The remaining viscoelastic was aspirated.    Wounds were hydrated with balanced salt solution.  The anterior chamber was inflated to a physiologic pressure with balanced salt solution.  No wound leaks were noted. Moxifloxacin  was injected intracamerally.  Timolol  and Brimonidine  drops were applied to the eye.  The patient was taken to the recovery room in stable condition without complications of anesthesia or surgery.  [employer] 06/06/2024, 9:02 AM

## 2024-06-06 NOTE — Anesthesia Postprocedure Evaluation (Signed)
 Anesthesia Post Note  Patient: Kim Mitchell  Procedure(s) Performed: PHACOEMULSIFICATION, CATARACT, WITH IOL INSERTION 10.01 01:14.9 (Left: Eye)  Patient location during evaluation: PACU Anesthesia Type: MAC Level of consciousness: awake and alert Pain management: pain level controlled Vital Signs Assessment: post-procedure vital signs reviewed and stable Respiratory status: spontaneous breathing, nonlabored ventilation, respiratory function stable and patient connected to nasal cannula oxygen Cardiovascular status: blood pressure returned to baseline and stable Postop Assessment: no apparent nausea or vomiting Anesthetic complications: no   No notable events documented.   Last Vitals:  Vitals:   06/06/24 0905 06/06/24 0909  BP: 136/83 132/88  Pulse: 65 73  Resp: 16 20  Temp: 36.7 C 36.7 C  SpO2: 100% 98%    Last Pain:  Vitals:   06/06/24 0909  TempSrc:   PainSc: 0-No pain                 Debby Mines

## 2024-06-06 NOTE — H&P (Signed)
 [facility]   Primary Care Physician:  Patient, No Pcp Per Ophthalmologist: Dr. Feliciano Ober  Pre-Procedure History & Physical: HPI:  Kim Mitchell is a 68 y.o. female here for cataract surgery.   Past Medical History:  Diagnosis Date   Acute left ankle pain 12/28/2020   Arthritis    Chronic insomnia 02/16/2022   COVID-19    Encounter for administration of vaccine 10/27/2022   Fibroids    confrimed from medical records   GERD (gastroesophageal reflux disease)    H. pylori infection    Heart murmur    History of kidney stones    Hx of sickle cell trait    IBS (irritable bowel syndrome)    Kidney stone    2004   Leukocytes in urine 12/16/2019   Leukopenia    Leukopenia    Screening for blood or protein in urine 12/16/2019   Sickle cell trait (HCC)    Soft tissue disorder of ankle 12/28/2020   Swelling of joint of hand 12/16/2019   Vaginal dryness, menopausal 12/16/2019    Past Surgical History:  Procedure Laterality Date   BONE MARROW BIOPSY N/A    BREAST BIOPSY Right 01/29/2021   Affirm bx-X clip-Benign   BREAST BIOPSY Right 03/02/2021   Sffirm bx- coil clip, path pending    BREAST CYST EXCISION Right 50 years ago ~1970   neg   BREAST SURGERY     LAPAROSCOPIC ASSISTED VAGINAL HYSTERECTOMY  11/18/2009   partial hysterectomy only ovaries left no h/o h/o abnormal pap    LITHOTRIPSY Right    2004   TUBAL LIGATION     1985    Prior to Admission medications   Medication Sig Start Date End Date Taking? Authorizing Provider  Calcium Carbonate-Vitamin D  600-400 MG-UNIT tablet Take 2 tablets by mouth daily.   Yes [provider]  docusate sodium (COLACE) 100 MG capsule Take 100 mg by mouth as needed for mild constipation.   Yes [provider]  Iron, Ferrous Sulfate, 325 (65 Fe) MG TABS Take 1 tablet by mouth every other day. 12/08/20  Yes [provider]  Melatonin 10 MG CAPS Take 10 mg by mouth.   Yes [provider]   Multiple Vitamins-Minerals (CENTRUM SILVER PO) Take 1 tablet by mouth daily.   Yes [provider]  acetaminophen (TYLENOL) 500 MG tablet Take 500 mg by mouth every 6 (six) hours as needed.    [provider]  fluticasone  (FLONASE ) 50 MCG/ACT nasal spray Place 2 sprays into both nostrils daily. 06/05/24   Tullo, Teresa L, MD  levocetirizine (XYZAL ) 5 MG tablet Take 1 tablet (5 mg total) by mouth every evening. 06/05/24   Marylynn Verneita CROME, MD  tiZANidine  (ZANAFLEX ) 4 MG tablet Take 0.5 tablets (2 mg total) by mouth every 6 (six) hours as needed for muscle spasms. 06/05/24   Marylynn Verneita CROME, MD    Allergies as of 05/20/2024 - Review Complete 05/01/2024  Allergen Reaction Noted   Sulfa antibiotics Swelling 12/16/2019   Demerol [meperidine hcl] Rash 12/16/2019    Family History  Problem Relation Age of Onset   Cancer Sister        jaw    Heart attack Brother    Cancer Brother        GU cancer   Lung cancer Maternal Aunt    Lung cancer Maternal Aunt    Hypertension Maternal Grandmother    Cancer Maternal Grandfather        ?  type   Breast cancer Cousin    Uterine cancer Cousin    Hypertension Other        siblings     Social History   Socioeconomic History   Marital status: Married    Spouse name: Not on file   Number of children: Not on file   Years of education: Not on file   Highest education level: Not on file  Occupational History   Not on file  Tobacco Use   Smoking status: Never   Smokeless tobacco: Never  Vaping Use   Vaping status: Never Used  Substance and Sexual Activity   Alcohol use: Not Currently   Drug use: Never   Sexual activity: Not Currently  Other Topics Concern   Not on file  Social History Narrative   7 siblings, 4 living as of 02/12/21    Retired Arts administrator    2 female kids 45, 37 02/12/21    [place] up [place] moved to [place]  2nd grade relocated here 2020   College eduation   Social Drivers of Health   Financial Resource Strain:  Patient Declined (04/26/2024)   Overall Financial Resource Strain (CARDIA)    Difficulty of Paying Living Expenses: Patient declined  Food Insecurity: Patient Declined (04/26/2024)   Hunger Vital Sign    Worried About Running Out of Food in the Last Year: Patient declined    Ran Out of Food in the Last Year: Patient declined  Transportation Needs: No Transportation Needs (04/26/2024)   PRAPARE - Administrator, Civil Service (Medical): No    Lack of Transportation (Non-Medical): No  Physical Activity: Sufficiently Active (04/26/2024)   Exercise Vital Sign    Days of Exercise per Week: 3 days    Minutes of Exercise per Session: 60 min  Stress: No Stress Concern Present (04/26/2024)   [employer] of Occupational Health - Occupational Stress Questionnaire    Feeling of Stress: Not at all  Social Connections: Socially Integrated (04/26/2024)   Social Connection and Isolation Panel    Frequency of Communication with Friends and Family: More than three times a week    Frequency of Social Gatherings with Friends and Family: More than three times a week    Attends Religious Services: More than 4 times per year    Active Member of [employer] or Organizations: Yes    Attends Engineer, structural: More than 4 times per year    Marital Status: Married  Catering manager Violence: Not At Risk (12/05/2023)   Humiliation, Afraid, Rape, and Kick questionnaire    Fear of Current or Ex-Partner: No    Emotionally Abused: No    Physically Abused: No    Sexually Abused: No    Review of Systems: See HPI, otherwise negative ROS  Physical Exam: Ht 5' 6 (1.676 m)   Wt 68.9 kg   BMI 24.53 kg/m  General:   Alert, cooperative in NAD Head:  Normocephalic and atraumatic. Respiratory:  Normal work of breathing. Cardiovascular:  RRR  Impression/Plan: Kim Mitchell is here for cataract surgery.  Risks, benefits, limitations, and alternatives regarding cataract surgery have been  reviewed with the patient.  Questions have been answered.  All parties agreeable.   Feliciano Bryan Ober, MD  06/06/2024, 7:08 AM

## 2024-06-12 NOTE — Discharge Instructions (Signed)

## 2024-06-13 ENCOUNTER — Encounter
Admission: RE | Disposition: A | Discharge status: Home / Self Care | Payer: Self-pay | Source: Home / Self Care | Attending: Ophthalmology

## 2024-06-13 ENCOUNTER — Other Ambulatory Visit: Payer: Self-pay

## 2024-06-13 ENCOUNTER — Ambulatory Visit: Payer: Self-pay | Admitting: Anesthesiology

## 2024-06-13 ENCOUNTER — Ambulatory Visit
Admission: RE | Admit: 2024-06-13 | Discharge: 2024-06-13 | Disposition: A | Discharge status: Home / Self Care | Attending: Ophthalmology | Admitting: Ophthalmology

## 2024-06-13 ENCOUNTER — Encounter: Payer: Self-pay | Admitting: Ophthalmology

## 2024-06-13 DIAGNOSIS — H2511 Age-related nuclear cataract, right eye: Secondary | ICD-10-CM | POA: Diagnosis present

## 2024-06-13 DIAGNOSIS — Z780 Asymptomatic menopausal state: Secondary | ICD-10-CM | POA: Insufficient documentation

## 2024-06-13 HISTORY — DX: Iron deficiency anemia, unspecified: D50.9

## 2024-06-13 SURGERY — PHACOEMULSIFICATION, CATARACT, WITH IOL INSERTION
Anesthesia: Monitor Anesthesia Care | Site: Eye | Laterality: Right

## 2024-06-13 MED ORDER — LIDOCAINE HCL (PF) 2 % IJ SOLN
INTRAOCULAR | Status: DC | PRN
  Administered 2024-06-13: 4 mL via INTRAOCULAR

## 2024-06-13 MED ORDER — BRIMONIDINE TARTRATE-TIMOLOL 0.2-0.5 % OP SOLN
OPHTHALMIC | Status: DC | PRN
  Administered 2024-06-13: 1 [drp] via OPHTHALMIC

## 2024-06-13 MED ORDER — SIGHTPATH DOSE#1 BSS IO SOLN
INTRAOCULAR | Status: DC | PRN
  Administered 2024-06-13: 15 mL via INTRAOCULAR

## 2024-06-13 MED ORDER — ARMC OPHTHALMIC DILATING DROPS
1.0000 | OPHTHALMIC | Status: DC | PRN
  Administered 2024-06-13 (×3): 1 via OPHTHALMIC

## 2024-06-13 MED ORDER — FENTANYL CITRATE (PF) 100 MCG/2ML IJ SOLN
INTRAMUSCULAR | Status: AC
Start: 2024-06-13 — End: 2024-06-13
  Filled 2024-06-13: qty 2

## 2024-06-13 MED ORDER — MOXIFLOXACIN HCL 0.5 % OP SOLN
OPHTHALMIC | Status: DC | PRN
  Administered 2024-06-13: .2 mL via OPHTHALMIC

## 2024-06-13 MED ORDER — ARMC OPHTHALMIC DILATING DROPS
OPHTHALMIC | Status: AC
  Filled 2024-06-13: qty 0.5

## 2024-06-13 MED ORDER — TETRACAINE HCL 0.5 % OP SOLN
OPHTHALMIC | Status: AC
Start: 2024-06-13 — End: 2024-06-13
  Filled 2024-06-13: qty 4

## 2024-06-13 MED ORDER — MIDAZOLAM HCL 2 MG/2ML IJ SOLN
INTRAMUSCULAR | Status: DC | PRN
  Administered 2024-06-13: 2 mg via INTRAVENOUS

## 2024-06-13 MED ORDER — SIGHTPATH DOSE#1 NA HYALUR & NA CHOND-NA HYALUR IO KIT
PACK | INTRAOCULAR | Status: DC | PRN
  Administered 2024-06-13: 1 via OPHTHALMIC

## 2024-06-13 MED ORDER — MIDAZOLAM HCL 2 MG/2ML IJ SOLN
INTRAMUSCULAR | Status: AC
  Filled 2024-06-13: qty 2

## 2024-06-13 MED ORDER — FENTANYL CITRATE (PF) 100 MCG/2ML IJ SOLN
INTRAMUSCULAR | Status: DC | PRN
  Administered 2024-06-13: 50 ug via INTRAVENOUS

## 2024-06-13 MED ORDER — LACTATED RINGERS IV SOLN: INTRAVENOUS | Status: DC

## 2024-06-13 MED ORDER — SIGHTPATH DOSE#1 BSS IO SOLN
INTRAOCULAR | Status: DC | PRN
  Administered 2024-06-13: 83 mL via OPHTHALMIC

## 2024-06-13 MED ORDER — TETRACAINE HCL 0.5 % OP SOLN
1.0000 [drp] | OPHTHALMIC | Status: DC | PRN
  Administered 2024-06-13 (×3): 1 [drp] via OPHTHALMIC

## 2024-06-13 SURGICAL SUPPLY — 10 items
DISSECTOR HYDRO NUCLEUS 50X22 (MISCELLANEOUS) ×1 IMPLANT
DRSG TEGADERM 2-3/8X2-3/4 SM (GAUZE/BANDAGES/DRESSINGS) ×1 IMPLANT
FEE CATARACT SUITE SIGHTPATH (MISCELLANEOUS) ×1 IMPLANT
GLOVE BIOGEL PI IND STRL 8 (GLOVE) ×1 IMPLANT
GLOVE SURG LX STRL 7.5 STRW (GLOVE) ×1 IMPLANT
GLOVE SURG SYN 6.5 PF PI BL (GLOVE) ×1 IMPLANT
LENS IOL CLRN PAN PRO TRC 6.0 IMPLANT
NDL FILTER BLUNT 18X1 1/2 (NEEDLE) ×1 IMPLANT
NEEDLE FILTER BLUNT 18X1 1/2 (NEEDLE) ×1 IMPLANT
SYR 3ML LL SCALE MARK (SYRINGE) ×1 IMPLANT

## 2024-06-13 NOTE — Anesthesia Postprocedure Evaluation (Signed)
 Anesthesia Post Note  Patient: Kim Mitchell  Procedure(s) Performed: PHACOEMULSIFICATION, CATARACT, WITH IOL INSERTION 5.48 00:48.2 (Right: Eye)  Patient location during evaluation: PACU Anesthesia Type: MAC Level of consciousness: awake and alert Pain management: pain level controlled Vital Signs Assessment: post-procedure vital signs reviewed and stable Respiratory status: spontaneous breathing, nonlabored ventilation, respiratory function stable and patient connected to nasal cannula oxygen Cardiovascular status: stable and blood pressure returned to baseline Postop Assessment: no apparent nausea or vomiting Anesthetic complications: no   No notable events documented.   Last Vitals:  Vitals:   06/13/24 0901 06/13/24 0907  BP: 113/87 128/80  Pulse: 65 68  Resp: 17 17  Temp: 36.4 C (!) 36.1 C  SpO2: 98% 98%    Last Pain:  Vitals:   06/13/24 0907  TempSrc:   PainSc: 0-No pain                 Donny JAYSON Mu

## 2024-06-13 NOTE — Anesthesia Preprocedure Evaluation (Addendum)
 Anesthesia Evaluation  Patient identified by MRN, date of birth, ID band Patient awake    Reviewed: Allergy & Precautions, H&P , NPO status , Patient's Chart, lab work & pertinent test results  Airway Mallampati: IV  TM Distance: >3 FB Neck ROM: Full    Dental no notable dental hx.    Pulmonary neg pulmonary ROS   Pulmonary exam normal breath sounds clear to auscultation       Cardiovascular negative cardio ROS Normal cardiovascular exam+ Valvular Problems/Murmurs  Rhythm:Regular Rate:Normal  Murmur not appreciate today    Neuro/Psych  Neuromuscular disease negative neurological ROS  negative psych ROS   GI/Hepatic negative GI ROS, Neg liver ROS,GERD  ,,  Endo/Other  negative endocrine ROS    Renal/GU Renal diseasenegative Renal ROS  negative genitourinary   Musculoskeletal negative musculoskeletal ROS (+) Arthritis ,    Abdominal   Peds negative pediatric ROS (+)  Hematology negative hematology ROS (+) Blood dyscrasia, anemia   Anesthesia Other Findings GERD (gastroesophageal reflux disease)  Heart murmur Fibroids  Sickle cell trait (HCC) COVID-19  IBS (irritable bowel syndrome) Leukopenia  Kidney stone H. pylori infection Vaginal dryness, menopausal Chronic insomnia  Leukocytes in urine  Soft tissue disorder of ankle Swelling of joint of hand Acute left ankle pain  History of kidney stones Arthritis  Hx of sickle cell trait Leukopenia  Iron deficiency anemia     Reproductive/Obstetrics negative OB ROS                              Anesthesia Physical Anesthesia Plan  ASA: 2  Anesthesia Plan: MAC   Post-op Pain Management:    Induction: Intravenous  PONV Risk Score and Plan:   Airway Management Planned: Natural Airway and Nasal Cannula  Additional Equipment:   Intra-op Plan:   Post-operative Plan:   Informed Consent: I have reviewed the patients History  and Physical, chart, labs and discussed the procedure including the risks, benefits and alternatives for the proposed anesthesia with the patient or authorized representative who has indicated his/her understanding and acceptance.     Dental Advisory Given  Plan Discussed with: Anesthesiologist, CRNA and Surgeon  Anesthesia Plan Comments: (Patient consented for risks of anesthesia including but not limited to:  - adverse reactions to medications - damage to eyes, teeth, lips or other oral mucosa - nerve damage due to positioning  - sore throat or hoarseness - Damage to heart, brain, nerves, lungs, other parts of body or loss of life  Patient voiced understanding and assent.)        Anesthesia Quick Evaluation

## 2024-06-13 NOTE — Transfer of Care (Signed)
 Immediate Anesthesia Transfer of Care Note  Patient: Kim Mitchell  Procedure(s) Performed: PHACOEMULSIFICATION, CATARACT, WITH IOL INSERTION 5.48 00:48.2 (Right: Eye)  Patient Location: PACU  Anesthesia Type: MAC  Level of Consciousness: awake, alert  and patient cooperative  Airway and Oxygen Therapy: Patient Spontanous Breathing and Patient connected to supplemental oxygen  Post-op Assessment: Post-op Vital signs reviewed, Patient's Cardiovascular Status Stable, Respiratory Function Stable, Patent Airway and No signs of Nausea or vomiting  Post-op Vital Signs: Reviewed and stable  Complications: No notable events documented.

## 2024-06-13 NOTE — Op Note (Signed)
 OPERATIVE NOTE  Kim Mitchell [id number] 06/13/2024   PREOPERATIVE DIAGNOSIS: Nuclear sclerotic cataract right eye. H25.11   POSTOPERATIVE DIAGNOSIS: Nuclear sclerotic cataract right eye. H25.11   PROCEDURE:  Phacoemulsification with posterior chamber intraocular lens placement of the right eye  Ultrasound time: Procedure(s): PHACOEMULSIFICATION, CATARACT, WITH IOL INSERTION 5.48 00:48.2 (Right)  LENS:   Implant Name Type Inv. Item Serial No. Manufacturer Lot No. LRB No. Used Action  LENS IOL CLRN PAN PRO TRC 6.0 - [id number]  LENS IOL CLRN PAN PRO TRC 6.0 [id number] [employer]  Right 1 Implanted      SURGEON:  Feliciano HERO. Enola, MD   ANESTHESIA:  Topical with tetracaine  drops, augmented with 1% preservative-free intracameral lidocaine .   COMPLICATIONS:  None.   DESCRIPTION OF PROCEDURE:  The patient was identified in the holding room and transported to the operating room and placed in the supine position under the operating microscope.  The right eye was identified as the operative eye, which was prepped and draped in the usual sterile ophthalmic fashion.   A 1 millimeter clear-corneal paracentesis was made superotemporally. Preservative-free 1% lidocaine  mixed with 1:1,000 bisulfite-free aqueous solution of epinephrine  was injected into the anterior chamber. The anterior chamber was then filled with Viscoat viscoelastic. A 2.4 millimeter keratome was used to make a clear-corneal incision inferotemporally. A curvilinear capsulorrhexis was made with a cystotome and capsulorrhexis forceps. Balanced salt solution was used to hydrodissect and hydrodelineate the nucleus. Phacoemulsification was then used to remove the lens nucleus and epinucleus. The remaining cortex was then removed using the irrigation and aspiration handpiece. Provisc was then placed into the capsular bag to distend it for lens placement. A +6.00 D PXCAT0 intraocular lens was then injected into the capsular bag. The  remaining viscoelastic was aspirated.   Wounds were hydrated with balanced salt solution.  The anterior chamber was inflated to a physiologic pressure with balanced salt solution.  No wound leaks were noted. Moxifloxacin  was injected intracamerally.  Timolol  and Brimonidine  drops were applied to the eye.  The patient was taken to the recovery room in stable condition without complications of anesthesia or surgery.  [name] 06/13/2024, 8:59 AM

## 2024-06-13 NOTE — H&P (Signed)
 [facility]   Primary Care Physician:  Bair, Kalpana, MD Ophthalmologist: Dr. Feliciano Ober  Pre-Procedure History & Physical: HPI:  Kim Mitchell is a 68 y.o. female here for cataract surgery.   Past Medical History:  Diagnosis Date   Acute left ankle pain 12/28/2020   Arthritis    Chronic insomnia 02/16/2022   COVID-19    Encounter for administration of vaccine 10/27/2022   Fibroids    confrimed from medical records   GERD (gastroesophageal reflux disease)    H. pylori infection    Heart murmur    History of kidney stones    Hx of sickle cell trait    IBS (irritable bowel syndrome)    Kidney stone    2004   Leukocytes in urine 12/16/2019   Leukopenia    Leukopenia    Screening for blood or protein in urine 12/16/2019   Sickle cell trait (HCC)    Soft tissue disorder of ankle 12/28/2020   Swelling of joint of hand 12/16/2019   Vaginal dryness, menopausal 12/16/2019    Past Surgical History:  Procedure Laterality Date   BONE MARROW BIOPSY N/A    BREAST BIOPSY Right 01/29/2021   Affirm bx-X clip-Benign   BREAST BIOPSY Right 03/02/2021   Sffirm bx- coil clip, path pending    BREAST CYST EXCISION Right 50 years ago ~1970   neg   BREAST SURGERY     CATARACT EXTRACTION W/PHACO Left 06/06/2024   Procedure: PHACOEMULSIFICATION, CATARACT, WITH IOL INSERTION 10.01 01:14.9;  Surgeon: Ober Feliciano Hugger, MD;  Location: [facility];  Service: Ophthalmology;  Laterality: Left;   LAPAROSCOPIC ASSISTED VAGINAL HYSTERECTOMY  11/18/2009   partial hysterectomy only ovaries left no h/o h/o abnormal pap    LITHOTRIPSY Right    2004   TUBAL LIGATION     1985    Prior to Admission medications   Medication Sig Start Date End Date Taking? Authorizing Provider  acetaminophen (TYLENOL) 500 MG tablet Take 500 mg by mouth every 6 (six) hours as needed.    [provider]  Calcium Carbonate-Vitamin D  600-400 MG-UNIT tablet Take 2 tablets by mouth daily.     [provider]  docusate sodium (COLACE) 100 MG capsule Take 100 mg by mouth as needed for mild constipation.    [provider]  fluticasone  (FLONASE ) 50 MCG/ACT nasal spray Place 2 sprays into both nostrils daily. 06/05/24   Tullo, Teresa L, MD  Iron, Ferrous Sulfate, 325 (65 Fe) MG TABS Take 1 tablet by mouth every other day. 12/08/20   [provider]  levocetirizine (XYZAL ) 5 MG tablet Take 1 tablet (5 mg total) by mouth every evening. 06/05/24   Marylynn Verneita CROME, MD  Melatonin 10 MG CAPS Take 10 mg by mouth.    [provider]  Multiple Vitamins-Minerals (CENTRUM SILVER PO) Take 1 tablet by mouth daily.    [provider]  tiZANidine  (ZANAFLEX ) 4 MG tablet Take 0.5 tablets (2 mg total) by mouth every 6 (six) hours as needed for muscle spasms. 06/05/24   Marylynn Verneita CROME, MD    Allergies as of 05/20/2024 - Review Complete 05/01/2024  Allergen Reaction Noted   Sulfa antibiotics Swelling 12/16/2019   Demerol [meperidine hcl] Rash 12/16/2019    Family History  Problem Relation Age of Onset   Cancer Sister        jaw    Heart attack Brother    Cancer Brother  GU cancer   Lung cancer Maternal Aunt    Lung cancer Maternal Aunt    Hypertension Maternal Grandmother    Cancer Maternal Grandfather        ?type   Breast cancer Cousin    Uterine cancer Cousin    Hypertension Other        siblings     Social History   Socioeconomic History   Marital status: Married    Spouse name: Not on file   Number of children: Not on file   Years of education: Not on file   Highest education level: Not on file  Occupational History   Not on file  Tobacco Use   Smoking status: Never   Smokeless tobacco: Never  Vaping Use   Vaping status: Never Used  Substance and Sexual Activity   Alcohol use: Not Currently   Drug use: Never   Sexual activity: Not Currently  Other Topics Concern   Not on file  Social History Narrative   7 siblings, 4  living as of 02/12/21    Retired Arts administrator    2 female kids 45, 37 02/12/21    [place]  2nd grade relocated here [employer] eduation   Social Drivers of Health   Financial Resource Strain: Patient Declined (04/26/2024)   Overall Financial Resource Strain (CARDIA)    Difficulty of Paying Living Expenses: Patient declined  Food Insecurity: Patient Declined (04/26/2024)   Hunger Vital Sign    Worried About Running Out of Food in the Last Year: Patient declined    Ran Out of Food in the Last Year: Patient declined  Transportation Needs: No Transportation Needs (04/26/2024)   PRAPARE - Administrator, Civil Service (Medical): No    Lack of Transportation (Non-Medical): No  Physical Activity: Sufficiently Active (04/26/2024)   Exercise Vital Sign    Days of Exercise per Week: 3 days    Minutes of Exercise per Session: 60 min  Stress: No Stress Concern Present (04/26/2024)   [employer] of Occupational Health - Occupational Stress Questionnaire    Feeling of Stress: Not at all  Social Connections: Socially Integrated (04/26/2024)   Social Connection and Isolation Panel    Frequency of Communication with Friends and Family: More than three times a week    Frequency of Social Gatherings with Friends and Family: More than three times a week    Attends Religious Services: More than 4 times per year    Active Member of [employer] or Organizations: Yes    Attends Engineer, structural: More than 4 times per year    Marital Status: Married  Catering manager Violence: Not At Risk (12/05/2023)   Humiliation, Afraid, Rape, and Kick questionnaire    Fear of Current or Ex-Partner: No    Emotionally Abused: No    Physically Abused: No    Sexually Abused: No    Review of Systems: See HPI, otherwise negative ROS  Physical Exam: There were no vitals taken for this visit. General:   Alert, cooperative in NAD Head:  Normocephalic and  atraumatic. Respiratory:  Normal work of breathing. Cardiovascular:  RRR  Impression/Plan: Kim Mitchell is here for cataract surgery.  Risks, benefits, limitations, and alternatives regarding cataract surgery have been reviewed with the patient.  Questions have been answered.  All parties agreeable.   Feliciano Bryan Ober, MD  06/13/2024, 7:12 AM

## 2024-06-14 ENCOUNTER — Encounter: Payer: Self-pay | Admitting: Ophthalmology

## 2024-07-11 ENCOUNTER — Encounter

## 2024-08-28 ENCOUNTER — Encounter: Payer: Self-pay | Admitting: Oncology

## 2024-08-28 ENCOUNTER — Inpatient Hospital Stay: Attending: Oncology

## 2024-08-28 ENCOUNTER — Inpatient Hospital Stay: Admitting: Oncology

## 2024-08-28 VITALS — BP 127/90 | HR 72 | Temp 96.4°F | Resp 16 | Wt 153.0 lb

## 2024-08-28 DIAGNOSIS — Z8616 Personal history of COVID-19: Secondary | ICD-10-CM | POA: Diagnosis not present

## 2024-08-28 DIAGNOSIS — Z87442 Personal history of urinary calculi: Secondary | ICD-10-CM | POA: Insufficient documentation

## 2024-08-28 DIAGNOSIS — R5383 Other fatigue: Secondary | ICD-10-CM | POA: Diagnosis not present

## 2024-08-28 DIAGNOSIS — Z803 Family history of malignant neoplasm of breast: Secondary | ICD-10-CM | POA: Insufficient documentation

## 2024-08-28 DIAGNOSIS — Z801 Family history of malignant neoplasm of trachea, bronchus and lung: Secondary | ICD-10-CM | POA: Insufficient documentation

## 2024-08-28 DIAGNOSIS — Z79899 Other long term (current) drug therapy: Secondary | ICD-10-CM | POA: Diagnosis not present

## 2024-08-28 DIAGNOSIS — D708 Other neutropenia: Secondary | ICD-10-CM | POA: Insufficient documentation

## 2024-08-28 DIAGNOSIS — Z8249 Family history of ischemic heart disease and other diseases of the circulatory system: Secondary | ICD-10-CM | POA: Diagnosis not present

## 2024-08-28 DIAGNOSIS — Z86018 Personal history of other benign neoplasm: Secondary | ICD-10-CM | POA: Diagnosis not present

## 2024-08-28 DIAGNOSIS — Z9842 Cataract extraction status, left eye: Secondary | ICD-10-CM | POA: Insufficient documentation

## 2024-08-28 DIAGNOSIS — Z882 Allergy status to sulfonamides status: Secondary | ICD-10-CM | POA: Insufficient documentation

## 2024-08-28 DIAGNOSIS — Z780 Asymptomatic menopausal state: Secondary | ICD-10-CM | POA: Diagnosis not present

## 2024-08-28 DIAGNOSIS — Z9841 Cataract extraction status, right eye: Secondary | ICD-10-CM | POA: Diagnosis not present

## 2024-08-28 DIAGNOSIS — Z885 Allergy status to narcotic agent status: Secondary | ICD-10-CM | POA: Insufficient documentation

## 2024-08-28 DIAGNOSIS — Z8049 Family history of malignant neoplasm of other genital organs: Secondary | ICD-10-CM | POA: Insufficient documentation

## 2024-08-28 DIAGNOSIS — Z90711 Acquired absence of uterus with remaining cervical stump: Secondary | ICD-10-CM | POA: Insufficient documentation

## 2024-08-28 LAB — CBC WITH DIFFERENTIAL (CANCER CENTER ONLY)
Abs Immature Granulocytes: 0 K/uL (ref 0.00–0.07)
Basophils Absolute: 0 K/uL (ref 0.0–0.1)
Basophils Relative: 1 %
Eosinophils Absolute: 0.2 K/uL (ref 0.0–0.5)
Eosinophils Relative: 7 %
HCT: 38.6 % (ref 36.0–46.0)
Hemoglobin: 12.6 g/dL (ref 12.0–15.0)
Immature Granulocytes: 0 %
Lymphocytes Relative: 54 %
Lymphs Abs: 1.8 K/uL (ref 0.7–4.0)
MCH: 24.1 pg — ABNORMAL LOW (ref 26.0–34.0)
MCHC: 32.6 g/dL (ref 30.0–36.0)
MCV: 73.9 fL — ABNORMAL LOW (ref 80.0–100.0)
Monocytes Absolute: 0.4 K/uL (ref 0.1–1.0)
Monocytes Relative: 11 %
Neutro Abs: 0.9 K/uL — ABNORMAL LOW (ref 1.7–7.7)
Neutrophils Relative %: 27 %
Platelet Count: 201 K/uL (ref 150–400)
RBC: 5.22 MIL/uL — ABNORMAL HIGH (ref 3.87–5.11)
RDW: 15.4 % (ref 11.5–15.5)
WBC Count: 3.4 K/uL — ABNORMAL LOW (ref 4.0–10.5)
nRBC: 0 % (ref 0.0–0.2)

## 2024-08-28 NOTE — Progress Notes (Signed)
 Hematology/Oncology Consult note [facility]  Telephone:([phone] [phone]  Patient Care Team: Bair, Kalpana, MD as PCP - General (Family Medicine) Melanee Annah BROCKS, MD as Consulting Physician (Hematology and Oncology)   Name of the patient: Kim Mitchell  [id number]  March 14, 1956   Date of visit: 08/28/24  Diagnosis-mild to moderate neutropenia of unclear etiology  Chief complaint/ Reason for visit-routine follow-up of neutropenia  Heme/Onc history: Patient is a 68 year old female seen in the past for leukopenia.  Her past medical history significant for GERD and irritable bowel syndrome.Results of blood work from 01/24/2022 showed white cell count of 3.5, H&H of 12.9/40.1 with an MCV of 73And a platelet count of 237.  Ferritin B12 folate normal.  Smear review unremarkable.  Conservative management for leukopenia was recommended at that time.   Results of blood work from 01/26/2024 showed white count of 3.1 with an ANC of 0.9.  H&H 12.4/38.3 with an MCV of 74.5 and a platelet count of 217.  Sickle cell trait positive.  Flow cytometry unremarkable.  ANA comprehensive panel, rheumatoid factor, B12 folate unremarkable.  T-cell gene rearrangement assay unremarkable.  Patient underwent bone marrow biopsy on 02/15/2024 which showed hypocellular bone marrow with mild erythroid and marrow acaricide dyspoiesis insufficient for the diagnosis of frank dysplasia.  Overall specimen limited for evaluation with significant aspiration artifact and sampling artifact cannot be excluded.  Findings were not pathognomonic for myelodysplastic syndrome.  Cytogenetics normal.  Myeloid mutation panel was unremarkable  Interval history- Discussed the use of AI scribe software for clinical note transcription with the patient, who gave verbal consent to proceed.  History of Present Illness   Kim Mitchell is a 68 year old female who presents with low white blood cell  count.  She has experienced low white blood cell count for at least the last couple of years, specifically a decrease in neutrophils, leading to a diagnosis of neutropenia. Despite this, she has no recurrent infections or other related symptoms.  A comprehensive workup, including a bone marrow biopsy, has been conducted.  She mentions feeling tired. There are no changes in appetite or weight, and she maintains a good appetite.  Denies any recurrent infections    ECOG PS- 1 Pain scale- 0   Review of systems- Review of Systems  Constitutional:  Positive for malaise/fatigue. Negative for chills, fever and weight loss.  HENT:  Negative for congestion, ear discharge and nosebleeds.   Eyes:  Negative for blurred vision.  Respiratory:  Negative for cough, hemoptysis, sputum production, shortness of breath and wheezing.   Cardiovascular:  Negative for chest pain, palpitations, orthopnea and claudication.  Gastrointestinal:  Negative for abdominal pain, blood in stool, constipation, diarrhea, heartburn, melena, nausea and vomiting.  Genitourinary:  Negative for dysuria, flank pain, frequency, hematuria and urgency.  Musculoskeletal:  Negative for back pain, joint pain and myalgias.  Skin:  Negative for rash.  Neurological:  Negative for dizziness, tingling, focal weakness, seizures, weakness and headaches.  Endo/Heme/Allergies:  Does not bruise/bleed easily.  Psychiatric/Behavioral:  Negative for depression and suicidal ideas. The patient does not have insomnia.       Allergies  Allergen Reactions   Sulfa Antibiotics Swelling    Facial Swelling   Demerol [Meperidine Hcl] Rash     Past Medical History:  Diagnosis Date   Acute left ankle pain 12/28/2020   Arthritis    Chronic insomnia 02/16/2022   COVID-19    Encounter for administration of vaccine  10/27/2022   Fibroids    confrimed from medical records   GERD (gastroesophageal reflux disease)    H. pylori infection    Heart  murmur    History of kidney stones    Hx of sickle cell trait    IBS (irritable bowel syndrome)    Iron deficiency anemia    Kidney stone    2004   Leukocytes in urine 12/16/2019   Leukopenia    Leukopenia    Screening for blood or protein in urine 12/16/2019   Sickle cell trait    Soft tissue disorder of ankle 12/28/2020   Swelling of joint of hand 12/16/2019   Vaginal dryness, menopausal 12/16/2019     Past Surgical History:  Procedure Laterality Date   BONE MARROW BIOPSY N/A    BREAST BIOPSY Right 01/29/2021   Affirm bx-X clip-Benign   BREAST BIOPSY Right 03/02/2021   Sffirm bx- coil clip, path pending    BREAST CYST EXCISION Right 50 years ago ~1970   neg   BREAST SURGERY     CATARACT EXTRACTION W/PHACO Left 06/06/2024   Procedure: PHACOEMULSIFICATION, CATARACT, WITH IOL INSERTION 10.01 01:14.9;  Surgeon: Enola Feliciano Hugger, MD;  Location: [facility];  Service: Ophthalmology;  Laterality: Left;   CATARACT EXTRACTION W/PHACO Right 06/13/2024   Procedure: PHACOEMULSIFICATION, CATARACT, WITH IOL INSERTION 5.48 00:48.2;  Surgeon: Enola Feliciano Hugger, MD;  Location: [facility];  Service: Ophthalmology;  Laterality: Right;   LAPAROSCOPIC ASSISTED VAGINAL HYSTERECTOMY  11/18/2009   partial hysterectomy only ovaries left no h/o h/o abnormal pap    LITHOTRIPSY Right    2004   TUBAL LIGATION     1985    Social History   Socioeconomic History   Marital status: Married    Spouse name: Not on file   Number of children: Not on file   Years of education: Not on file   Highest education level: Not on file  Occupational History   Not on file  Tobacco Use   Smoking status: Never   Smokeless tobacco: Never  Vaping Use   Vaping status: Never Used  Substance and Sexual Activity   Alcohol use: Not Currently   Drug use: Never   Sexual activity: Not Currently  Other Topics Concern   Not on file  Social History Narrative   7 siblings, 4 living as of  02/12/21    Retired Arts Administrator    2 female kids 45, 37 02/12/21    [place] up [place] to [place]  2nd grade relocated here 2020   College eduation   Social Drivers of Health   Financial Resource Strain: Patient Declined (04/26/2024)   Overall Financial Resource Strain (CARDIA)    Difficulty of Paying Living Expenses: Patient declined  Food Insecurity: Patient Declined (04/26/2024)   Hunger Vital Sign    Worried About Running Out of Food in the Last Year: Patient declined    Ran Out of Food in the Last Year: Patient declined  Transportation Needs: No Transportation Needs (04/26/2024)   PRAPARE - Administrator, Civil Service (Medical): No    Lack of Transportation (Non-Medical): No  Physical Activity: Sufficiently Active (04/26/2024)   Exercise Vital Sign    Days of Exercise per Week: 3 days    Minutes of Exercise per Session: 60 min  Stress: No Stress Concern Present (04/26/2024)   [employer] of Occupational Health - Occupational Stress Questionnaire    Feeling of Stress: Not at all  Social Connections:  Socially Integrated (04/26/2024)   Social Connection and Isolation Panel    Frequency of Communication with Friends and Family: More than three times a week    Frequency of Social Gatherings with Friends and Family: More than three times a week    Attends Religious Services: More than 4 times per year    Active Member of [employer] or Organizations: Yes    Attends Engineer, Structural: More than 4 times per year    Marital Status: Married  Catering Manager Violence: Not At Risk (12/05/2023)   Humiliation, Afraid, Rape, and Kick questionnaire    Fear of Current or Ex-Partner: No    Emotionally Abused: No    Physically Abused: No    Sexually Abused: No    Family History  Problem Relation Age of Onset   Cancer Sister        jaw    Heart attack Brother    Cancer Brother        GU cancer   Lung cancer Maternal Aunt    Lung cancer Maternal Aunt    Hypertension  Maternal Grandmother    Cancer Maternal Grandfather        ?type   Breast cancer Cousin    Uterine cancer Cousin    Hypertension Other        siblings      Current Outpatient Medications:    acetaminophen (TYLENOL) 500 MG tablet, Take 500 mg by mouth every 6 (six) hours as needed., Disp: , Rfl:    Calcium Carbonate-Vitamin D  600-400 MG-UNIT tablet, Take 2 tablets by mouth daily., Disp: , Rfl:    docusate sodium (COLACE) 100 MG capsule, Take 100 mg by mouth as needed for mild constipation., Disp: , Rfl:    fluticasone  (FLONASE ) 50 MCG/ACT nasal spray, Place 2 sprays into both nostrils daily., Disp: 18.2 mL, Rfl: 11   Iron, Ferrous Sulfate, 325 (65 Fe) MG TABS, Take 1 tablet by mouth every other day., Disp: , Rfl:    levocetirizine (XYZAL ) 5 MG tablet, Take 1 tablet (5 mg total) by mouth every evening., Disp: 90 tablet, Rfl: 2   Melatonin 10 MG CAPS, Take 10 mg by mouth., Disp: , Rfl:    Multiple Vitamins-Minerals (CENTRUM SILVER PO), Take 1 tablet by mouth daily., Disp: , Rfl:    tiZANidine  (ZANAFLEX ) 4 MG tablet, Take 0.5 tablets (2 mg total) by mouth every 6 (six) hours as needed for muscle spasms., Disp: 60 tablet, Rfl: 0  Physical exam:  Vitals:   08/28/24 1022  BP: (!) 127/90  Pulse: 72  Resp: 16  Temp: (!) 96.4 F (35.8 C)  TempSrc: Tympanic  SpO2: 99%  Weight: 153 lb (69.4 kg)   Physical Exam Cardiovascular:     Rate and Rhythm: Normal rate and regular rhythm.     Heart sounds: Normal heart sounds.  Pulmonary:     Effort: Pulmonary effort is normal.     Breath sounds: Normal breath sounds.  Lymphadenopathy:     Comments: No palpable cervical, supraclavicular, axillary or inguinal adenopathy    Skin:    General: Skin is warm and dry.  Neurological:     Mental Status: She is alert and oriented to person, place, and time.      I have personally reviewed labs listed below:    Latest Ref Rng & Units 01/10/2024   10:18 AM  CMP  Glucose 70 - 99 mg/dL 83   BUN 6 -  23 mg/dL 14  Creatinine 0.40 - 1.20 mg/dL 9.18   Sodium 864 - 854 mEq/L 142   Potassium 3.5 - 5.1 mEq/L 4.3   Chloride 96 - 112 mEq/L 106   CO2 19 - 32 mEq/L 28   Calcium 8.4 - 10.5 mg/dL 9.5   Total Protein 6.0 - 8.3 g/dL 7.3   Total Bilirubin 0.2 - 1.2 mg/dL 0.4   Alkaline Phos 39 - 117 U/L 57   AST 0 - 37 U/L 19   ALT 0 - 35 U/L 14       Latest Ref Rng & Units 08/28/2024   10:03 AM  CBC  WBC 4.0 - 10.5 K/uL 3.4   Hemoglobin 12.0 - 15.0 g/dL 87.3   Hematocrit 63.9 - 46.0 % 38.6   Platelets 150 - 400 K/uL 201       Assessment and plan- Patient is a 68 y.o. female here for a routine follow-up of mild chronic neutropenia  Assessment and Plan    Chronic neutropenia Stable for two years with stable white blood cell count. Bone marrow showed some dyspoiesis in the granulocytic and megakaryocytic lineage but not conclusive for the diagnosis of myelodysplastic syndrome.  Myeloid mutation panel also did not reveal any evidence of clonality no recurrent infections or complications. -Neutrophil count waxes and wanes between 0.7-1.2 without any clinical symptoms. CBC with differential in 4 months in 8 months and I will see her back in 8 months - Report recurrent infections, appetite changes, or weight changes.         Visit Diagnosis 1. Other neutropenia      Dr. Annah Skene, MD, MPH [facility] [phone] 08/28/2024 12:20 PM

## 2024-08-28 NOTE — Progress Notes (Signed)
 Pt reports fatigued has worsened, just feel tired all the time.

## 2024-11-04 ENCOUNTER — Encounter

## 2024-11-04 ENCOUNTER — Telehealth: Payer: Self-pay

## 2024-11-04 ENCOUNTER — Telehealth

## 2024-11-04 ENCOUNTER — Ambulatory Visit

## 2024-11-04 VITALS — Ht 66.0 in | Wt 153.0 lb

## 2024-11-04 DIAGNOSIS — R7303 Prediabetes: Secondary | ICD-10-CM | POA: Insufficient documentation

## 2024-11-04 DIAGNOSIS — Z13220 Encounter for screening for lipoid disorders: Secondary | ICD-10-CM

## 2024-11-04 DIAGNOSIS — M25512 Pain in left shoulder: Secondary | ICD-10-CM | POA: Diagnosis not present

## 2024-11-04 DIAGNOSIS — K5904 Chronic idiopathic constipation: Secondary | ICD-10-CM

## 2024-11-04 DIAGNOSIS — M25511 Pain in right shoulder: Secondary | ICD-10-CM

## 2024-11-04 DIAGNOSIS — J302 Other seasonal allergic rhinitis: Secondary | ICD-10-CM | POA: Diagnosis not present

## 2024-11-04 DIAGNOSIS — D708 Other neutropenia: Secondary | ICD-10-CM | POA: Diagnosis not present

## 2024-11-04 DIAGNOSIS — G8929 Other chronic pain: Secondary | ICD-10-CM | POA: Insufficient documentation

## 2024-11-04 NOTE — Telephone Encounter (Signed)
 Orders only encounter. Defer to virtual visit note from 11/04/24 for documentation:   1. Lipid screening (Primary) - Lipid panel; Future  2. Prediabetes - Comp Met (CMET); Future - HgB A1c; Future   Luke Shade, MD

## 2024-11-04 NOTE — Assessment & Plan Note (Signed)
 This is chronic in nature.  Exacerbated by putting pressure on the shoulders.  Managed with tizanidine  2 mg (1/2 tablet of 4 mg) once or twice a month.  Continue, not needing refill.  No side effect of medication.  Discussed heating pad to help with pain as well.

## 2024-11-04 NOTE — Patient Instructions (Signed)
 Please call our office to schedule fasting lab appointment and office visit with me in 2 months.

## 2024-11-04 NOTE — Assessment & Plan Note (Signed)
 This is chronic and stable on as needed Colace 100 mg.  Continue

## 2024-11-04 NOTE — Assessment & Plan Note (Signed)
 Chronic, managed with Xyzal  5 mg as needed and nasal Flonase  as needed.  Continue not needing refill at this time.

## 2024-11-04 NOTE — Progress Notes (Signed)
 "  Virtual Visit via Video Note  I connected with Kim Mitchell on 11/04/24 at  2:00 PM EST by a video enabled telemedicine application and verified that I am speaking with the correct person using two identifiers.  Patient Location: Home Provider Location: Home Office  I discussed the limitations, risks, security, and privacy concerns of performing an evaluation and management service by video and the availability of in person appointments. I also discussed with the patient that there may be a patient responsible charge related to this service. The patient expressed understanding and agreed to proceed.  Subjective: PCP: Kim Bruckner, MD TOC from previous PCP.  Chief Complaint  Patient presents with   Transfer of Care   HPI The patient is a pleasant 69 year old female presenting via virtual visit for transfer of care from previous primary care doctor.  -She has a history of osteoarthritis of bilateral shoulder.  She has seen ortho for this in the past.  Has received intraarticular injection for this in the past.  Currently she manages this with as needed tizanidine  2 mg (1/2 of 4 mg) once or twice a month.  She is a side sleeper and shoulder pain seems to be worse at nighttime.  She denies needing refill for this at this time  -Seasonal allergy managed with Xyzal  5 mg as needed and nasal Flonase .  Reports not needing refill at this time.  -She has a history of idiopathic leukopenia for which she has been following up with hematologist Dr. Melanee.  -She reports she has difficulty with sustaining sleep.  She goes to bed around midnight and wakes up around 10 AM in the morning.  She takes melatonin 10 mg nightly.  -She also takes calcium with vitamin D , Tylenol 500 mg as needed, Colace as needed for constipation and iron every other day.  -Previous labs from 01/10/2024 discussed which showed A1c in prediabetic range.  Stable electrolytes, liver, renal function.  Lipid panel stable as  well.  ROS: Per HPI Current Medications[1]  Observations/Objective: Today's Vitals   11/04/24 1408  Weight: 153 lb (69.4 kg)  Height: 5' 6 (1.676 m)   Physical Exam Constitutional:      General: She is not in acute distress. Neurological:     Mental Status: She is alert and oriented to person, place, and time.  Psychiatric:        Mood and Affect: Mood normal.   Physical exam limited due to nature of virtual visit.  Assessment and Plan: Patient is a pleasant 69 year old female presenting via virtual visit for transfer of care from previous PCP.  She was found to have elevated diastolic blood pressure on 08/28/2024 during hematology visit.  DBP was 90 mmHg.  She has ability to check blood pressure at home.  I recommend checking blood pressure 2-3 times a week and bringing home blood pressure readings during her follow-up visit with me in 2 months. Prediabetes Assessment & Plan: Reviewed A1c from 01/10/2024 which was 5.9%.  Recommend repeating A1c in 2 months.  Future lab ordered on lab only order.   Other neutropenia Assessment & Plan: This is chronic and stable.  Patient recommended to continue follow-up with him otologist Dr. Melanee.    Lipid screening  Chronic idiopathic constipation Assessment & Plan: This is chronic and stable on as needed Colace 100 mg.  Continue   Seasonal allergies Assessment & Plan: Chronic, managed with Xyzal  5 mg as needed and nasal Flonase  as needed.  Continue not needing refill  at this time.   Chronic pain of both shoulders Assessment & Plan: This is chronic in nature.  Exacerbated by putting pressure on the shoulders.  Managed with tizanidine  2 mg (1/2 tablet of 4 mg) once or twice a month.  Continue, not needing refill.  No side effect of medication.  Discussed heating pad to help with pain as well.      Follow Up Instructions: Return in about 2 months (around 01/02/2025) for chronic follow up, labs 2 days before appointment.   I  discussed the assessment and treatment plan with the patient. The patient was provided an opportunity to ask questions, and all were answered. The patient agreed with the plan and demonstrated an understanding of the instructions.   The patient was advised to call back or seek an in-person evaluation if the symptoms worsen or if the condition fails to improve as anticipated.  The above assessment and management plan was discussed with the patient. The patient verbalized understanding of and has agreed to the management plan.   Kim Drollinger, MD     [1]  Current Outpatient Medications:    acetaminophen (TYLENOL) 500 MG tablet, Take 500 mg by mouth every 6 (six) hours as needed., Disp: , Rfl:    Calcium Carbonate-Vitamin D  600-400 MG-UNIT tablet, Take 2 tablets by mouth daily., Disp: , Rfl:    docusate sodium (COLACE) 100 MG capsule, Take 100 mg by mouth as needed for mild constipation., Disp: , Rfl:    fluticasone  (FLONASE ) 50 MCG/ACT nasal spray, Place 2 sprays into both nostrils daily., Disp: 18.2 mL, Rfl: 11   Iron, Ferrous Sulfate, 325 (65 Fe) MG TABS, Take 1 tablet by mouth every other day., Disp: , Rfl:    levocetirizine (XYZAL ) 5 MG tablet, Take 1 tablet (5 mg total) by mouth every evening., Disp: 90 tablet, Rfl: 2   Melatonin 10 MG CAPS, Take 10 mg by mouth., Disp: , Rfl:    Multiple Vitamins-Minerals (CENTRUM SILVER PO), Take 1 tablet by mouth daily., Disp: , Rfl:    tiZANidine  (ZANAFLEX ) 4 MG tablet, Take 0.5 tablets (2 mg total) by mouth every 6 (six) hours as needed for muscle spasms., Disp: 60 tablet, Rfl: 0  "

## 2024-11-04 NOTE — Assessment & Plan Note (Signed)
 Reviewed A1c from 01/10/2024 which was 5.9%.  Recommend repeating A1c in 2 months.  Future lab ordered on lab only order.

## 2024-11-04 NOTE — Assessment & Plan Note (Signed)
 This is chronic and stable.  Patient recommended to continue follow-up with him otologist Dr. Melanee.

## 2024-12-10 ENCOUNTER — Ambulatory Visit: Payer: Medicare Other

## 2024-12-26 ENCOUNTER — Inpatient Hospital Stay

## 2024-12-27 ENCOUNTER — Inpatient Hospital Stay

## 2025-01-08 ENCOUNTER — Ambulatory Visit

## 2025-04-28 ENCOUNTER — Inpatient Hospital Stay

## 2025-04-28 ENCOUNTER — Inpatient Hospital Stay: Admitting: Oncology

## 2025-04-29 ENCOUNTER — Inpatient Hospital Stay

## 2025-04-29 ENCOUNTER — Inpatient Hospital Stay: Admitting: Oncology
